# Patient Record
Sex: Female | Born: 1995 | Hispanic: No | Marital: Married | Smoking: Never smoker
Health system: Southern US, Community
[De-identification: ages and names within clinical notes are randomized; demographics above are authoritative.]

## PROBLEM LIST (undated history)

## (undated) ENCOUNTER — Inpatient Hospital Stay (HOSPITAL_COMMUNITY): Payer: Self-pay

## (undated) DIAGNOSIS — Z789 Other specified health status: Secondary | ICD-10-CM

## (undated) DIAGNOSIS — D649 Anemia, unspecified: Secondary | ICD-10-CM

## (undated) DIAGNOSIS — T7840XA Allergy, unspecified, initial encounter: Secondary | ICD-10-CM

## (undated) HISTORY — DX: Other specified health status: Z78.9

## (undated) HISTORY — PX: NO PAST SURGERIES: SHX2092

## (undated) HISTORY — DX: Anemia, unspecified: D64.9

## (undated) HISTORY — DX: Allergy, unspecified, initial encounter: T78.40XA

---

## 2018-01-26 NOTE — L&D Delivery Note (Addendum)
Delivery Note At 3:14 PM a viable female was delivered via Vaginal, Spontaneous (Presentation:LOA).  APGAR: 8, 9; weight pending.  Placenta status: spontaneous and intact, Cord: 3 vessel cord    Anesthesia:  Epidural  Episiotomy: None Lacerations: 2nd degree;Labial;Periurethral Suture Repair: 3.0 vicryl rapide Est. Blood Loss (mL): 230  Mom to postpartum.  Baby to Couplet care / Skin to Skin.  Lattie Haw MD PGY-1, [place]  10/01/2018, 3:50 PM  Patient is a  G1P0 at [redacted]w[redacted]d who was admitted SROM/SOL, uncomplicated prenatal course.  She progressed with augmentation via pitocin during second stage.  I was gloved and present for delivery in its entirety.  Second stage of labor progressed, baby delivered after 2 hours.  Wende Mott, CNM 4:35 PM

## 2018-02-26 ENCOUNTER — Emergency Department (HOSPITAL_COMMUNITY)
Admission: EM | Admit: 2018-02-26 | Discharge: 2018-02-26 | Disposition: A | Discharge status: Home / Self Care | Payer: Medicaid Other | Attending: Emergency Medicine | Admitting: Emergency Medicine

## 2018-02-26 ENCOUNTER — Encounter (HOSPITAL_COMMUNITY): Payer: Self-pay

## 2018-02-26 DIAGNOSIS — R1013 Epigastric pain: Secondary | ICD-10-CM | POA: Diagnosis present

## 2018-02-26 LAB — CBC WITH DIFFERENTIAL/PLATELET
Abs Immature Granulocytes: 0.04 10*3/uL (ref 0.00–0.07)
Basophils Absolute: 0 10*3/uL (ref 0.0–0.1)
Basophils Relative: 0 %
Eosinophils Absolute: 0 10*3/uL (ref 0.0–0.5)
Eosinophils Relative: 0 %
HCT: 36.7 % (ref 36.0–46.0)
Hemoglobin: 12.3 g/dL (ref 12.0–15.0)
Immature Granulocytes: 0 %
LYMPHS PCT: 11 %
Lymphs Abs: 1.2 10*3/uL (ref 0.7–4.0)
MCH: 28.1 pg (ref 26.0–34.0)
MCHC: 33.5 g/dL (ref 30.0–36.0)
MCV: 83.8 fL (ref 80.0–100.0)
Monocytes Absolute: 0.4 10*3/uL (ref 0.1–1.0)
Monocytes Relative: 4 %
NEUTROS ABS: 8.7 10*3/uL — AB (ref 1.7–7.7)
Neutrophils Relative %: 85 %
Platelets: 220 10*3/uL (ref 150–400)
RBC: 4.38 MIL/uL (ref 3.87–5.11)
RDW: 12.5 % (ref 11.5–15.5)
WBC: 10.4 10*3/uL (ref 4.0–10.5)
nRBC: 0 % (ref 0.0–0.2)

## 2018-02-26 LAB — URINALYSIS, ROUTINE W REFLEX MICROSCOPIC
BILIRUBIN URINE: NEGATIVE
Glucose, UA: NEGATIVE mg/dL
HGB URINE DIPSTICK: NEGATIVE
Ketones, ur: 20 mg/dL — AB
Leukocytes, UA: NEGATIVE
Nitrite: NEGATIVE
Protein, ur: NEGATIVE mg/dL
Specific Gravity, Urine: 1.008 (ref 1.005–1.030)
pH: 6 (ref 5.0–8.0)

## 2018-02-26 LAB — COMPREHENSIVE METABOLIC PANEL
ALK PHOS: 37 U/L — AB (ref 38–126)
ALT: 13 U/L (ref 0–44)
AST: 16 U/L (ref 15–41)
Albumin: 4.2 g/dL (ref 3.5–5.0)
Anion gap: 13 (ref 5–15)
BILIRUBIN TOTAL: 0.3 mg/dL (ref 0.3–1.2)
BUN: 6 mg/dL (ref 6–20)
CALCIUM: 9.2 mg/dL (ref 8.9–10.3)
CO2: 19 mmol/L — ABNORMAL LOW (ref 22–32)
CREATININE: 0.42 mg/dL — AB (ref 0.44–1.00)
Chloride: 101 mmol/L (ref 98–111)
GFR calc Af Amer: >60 mL/min (ref >60)
GFR calc non Af Amer: >60 mL/min (ref >60)
Glucose, Bld: 100 mg/dL — ABNORMAL HIGH (ref 70–99)
Potassium: 3.4 mmol/L — ABNORMAL LOW (ref 3.5–5.1)
Sodium: 133 mmol/L — ABNORMAL LOW (ref 135–145)
TOTAL PROTEIN: 6.8 g/dL (ref 6.5–8.1)

## 2018-02-26 LAB — HCG, QUANTITATIVE, PREGNANCY: hCG, Beta Chain, Quant, S: 162561 m[IU]/mL — ABNORMAL HIGH (ref <5)

## 2018-02-26 LAB — LIPASE, BLOOD: Lipase: 25 U/L (ref 11–51)

## 2018-02-26 NOTE — Discharge Instructions (Addendum)
Try eating small frequent meals. Drink hydrating fluids such as Gatorade or G2. Follow-up with your OB as scheduled, return to ER for any worsening or concerning symptoms.

## 2018-02-26 NOTE — ED Triage Notes (Signed)
Pt reports abd pain that radiates to her back that started today. Pt pregnant. LMP 12/15/17. Denies any vaginal bleeding.

## 2018-02-26 NOTE — ED Notes (Signed)
Pt unable to void at this time. 

## 2018-02-26 NOTE — ED Notes (Signed)
Pt tolerating water well, denies any nausea

## 2018-02-26 NOTE — ED Provider Notes (Signed)
[facility] EMERGENCY DEPARTMENT Provider Note   CSN: [id number] Arrival date & time: 02/26/18  1945     History   Chief Complaint Chief Complaint  Patient presents with  . Abdominal Pain  . Back Pain    HPI Suzanne Velez is a 23 y.o. female.  23 year old female presents with complaint of epigastric abdominal pain.  Patient is approximately 2 months pregnant, LMP December 15, 2017, first pregnancy, scheduled for OB visit with first ultrasound next week on Thursday.  Patient states episode of epigastric pain that happened earlier today lasting 15 minutes, radiates to midback and has significantly lessened.  Patient reports having vomiting only in the mornings, has been able to tolerate small foods today including strawberries.  Patient denies nausea or vomiting at this time, changes in bowel or bladder habits, fevers, chills, vaginal discharge or bleeding.  Denies pelvic pain.  No other complaints or concerns.     History reviewed. No pertinent past medical history.  There are no active problems to display for this patient.   History reviewed. No pertinent surgical history.   OB History   No obstetric history on file.      Home Medications    Prior to Admission medications   Not on File    Family History No family history on file.  Social History Social History   Tobacco Use  . Smoking status: Not on file  Substance Use Topics  . Alcohol use: Not on file  . Drug use: Not on file     Allergies   Patient has no allergy information on record.   Review of Systems Review of Systems  Constitutional: Negative for chills and fever.  Respiratory: Negative for shortness of breath.   Cardiovascular: Negative for chest pain.  Gastrointestinal: Positive for abdominal pain, nausea and vomiting. Negative for constipation and diarrhea.  Genitourinary: Negative for dysuria, frequency, urgency, vaginal bleeding and vaginal discharge.  Musculoskeletal:  Positive for back pain.  Skin: Negative for rash and wound.  Allergic/Immunologic: Negative for immunocompromised state.  Neurological: Negative for dizziness and weakness.  Psychiatric/Behavioral: Negative for confusion.  All other systems reviewed and are negative.    Physical Exam Updated Vital Signs BP (!) 111/59 (BP Location: Right Arm)   Pulse 74   Temp 97.8 F (36.6 C) (Oral)   Resp 16   LMP 12/15/2017 (Approximate)   SpO2 100%   Physical Exam Vitals signs and nursing note reviewed.  Constitutional:      General: She is not in acute distress.    Appearance: She is well-developed. She is not diaphoretic.  HENT:     Head: Normocephalic and atraumatic.  Cardiovascular:     Rate and Rhythm: Normal rate and regular rhythm.     Heart sounds: Normal heart sounds. No murmur.  Pulmonary:     Effort: Pulmonary effort is normal.     Breath sounds: Normal breath sounds.  Abdominal:     General: Abdomen is flat. There is no distension.     Palpations: Abdomen is soft.     Tenderness: There is no abdominal tenderness. There is no right CVA tenderness or left CVA tenderness.  Musculoskeletal:     Thoracic back: She exhibits no tenderness and no bony tenderness.     Lumbar back: She exhibits no tenderness and no bony tenderness.  Skin:    General: Skin is warm and dry.     Coloration: Skin is not pale.     Findings: No rash.  Neurological:     Mental Status: She is alert and oriented to person, place, and time.  Psychiatric:        Behavior: Behavior normal.      ED Treatments / Results  Labs (all labs ordered are listed, but only abnormal results are displayed) Labs Reviewed  CBC WITH DIFFERENTIAL/PLATELET - Abnormal; Notable for the following components:      Result Value   Neutro Abs 8.7 (*)    All other components within normal limits  COMPREHENSIVE METABOLIC PANEL - Abnormal; Notable for the following components:   Sodium 133 (*)    Potassium 3.4 (*)    CO2  19 (*)    Glucose, Bld 100 (*)    Creatinine, Ser 0.42 (*)    Alkaline Phosphatase 37 (*)    All other components within normal limits  URINALYSIS, ROUTINE W REFLEX MICROSCOPIC - Abnormal; Notable for the following components:   Ketones, ur 20 (*)    All other components within normal limits  LIPASE, BLOOD  HCG, QUANTITATIVE, PREGNANCY    EKG None  Radiology No results found.  Procedures Procedures (including critical care time)  Medications Ordered in ED Medications - No data to display   Initial Impression / Assessment and Plan / ED Course  I have reviewed the triage vital signs and the nursing notes.  Pertinent labs & imaging results that were available during my care of the patient were reviewed by me and considered in my medical decision making (see chart for details).  Clinical Course as of Feb 26 2114  Sat Feb 26, 2018  [id number] 23 year old female presents with complaint of epigastric abdominal pain which occurred earlier today lasting 15 minutes and has mostly resolved.  On exam abdomen is soft and nontender.  Patient is approximately 2 months pregnant, scheduled for OB appointment in 1 week, denies pelvic pain, no pelvic tenderness on exam.  No reports of vaginal bleeding or discharge.  Lab work including CBC, CMP, urinalysis and lipase completed.  Chemistry with bicarb 19, urinalysis ketones.  Patient given p.o. fluids prior to discharge.   [LM]    Clinical Course User Index [LM] Jeannie FendMurphy, Aviva Wolfer A, PA-C   Final Clinical Impressions(s) / ED Diagnoses   Final diagnoses:  Epigastric pain    ED Discharge Orders    None       Alden HippMurphy, Zachary Lovins A, PA-C 02/26/18 2116    Marily MemosMesner, Jason, MD 02/26/18 2213

## 2018-03-03 ENCOUNTER — Other Ambulatory Visit (HOSPITAL_COMMUNITY): Payer: Self-pay | Admitting: Family

## 2018-03-03 DIAGNOSIS — Z369 Encounter for antenatal screening, unspecified: Secondary | ICD-10-CM

## 2018-03-17 ENCOUNTER — Encounter (HOSPITAL_COMMUNITY): Payer: Self-pay

## 2018-03-22 ENCOUNTER — Ambulatory Visit (HOSPITAL_COMMUNITY): Payer: Medicaid Other

## 2018-03-23 ENCOUNTER — Encounter (HOSPITAL_COMMUNITY): Payer: Self-pay

## 2018-03-24 ENCOUNTER — Encounter (HOSPITAL_COMMUNITY): Payer: Self-pay | Admitting: *Deleted

## 2018-03-24 ENCOUNTER — Ambulatory Visit (HOSPITAL_COMMUNITY): Payer: Medicaid Other | Admitting: *Deleted

## 2018-03-24 ENCOUNTER — Ambulatory Visit (HOSPITAL_BASED_OUTPATIENT_CLINIC_OR_DEPARTMENT_OTHER): Payer: Medicaid Other | Admitting: *Deleted

## 2018-03-24 ENCOUNTER — Ambulatory Visit (HOSPITAL_COMMUNITY)
Admission: RE | Admit: 2018-03-24 | Discharge: 2018-03-24 | Disposition: A | Discharge status: Home / Self Care | Payer: Medicaid Other | Source: Ambulatory Visit | Attending: Family | Admitting: Family

## 2018-03-24 VITALS — BP 114/68 | HR 98 | Ht 65.0 in | Wt 149.0 lb

## 2018-03-24 DIAGNOSIS — Z3A13 13 weeks gestation of pregnancy: Secondary | ICD-10-CM | POA: Diagnosis not present

## 2018-03-24 DIAGNOSIS — Z3682 Encounter for antenatal screening for nuchal translucency: Secondary | ICD-10-CM | POA: Insufficient documentation

## 2018-03-24 DIAGNOSIS — Z369 Encounter for antenatal screening, unspecified: Secondary | ICD-10-CM | POA: Diagnosis not present

## 2018-03-24 NOTE — Progress Notes (Signed)
[employer] interpreter utilized for RN intake.

## 2018-03-29 ENCOUNTER — Other Ambulatory Visit (HOSPITAL_COMMUNITY): Payer: Self-pay | Admitting: Obstetrics and Gynecology

## 2018-04-05 ENCOUNTER — Inpatient Hospital Stay (HOSPITAL_COMMUNITY)
Admission: AD | Admit: 2018-04-05 | Discharge: 2018-04-05 | Disposition: A | Discharge status: Home / Self Care | Payer: Medicaid Other | Attending: Obstetrics and Gynecology | Admitting: Obstetrics and Gynecology

## 2018-04-05 ENCOUNTER — Other Ambulatory Visit: Payer: Self-pay

## 2018-04-05 ENCOUNTER — Encounter (HOSPITAL_COMMUNITY): Payer: Self-pay | Admitting: *Deleted

## 2018-04-05 DIAGNOSIS — W19XXXA Unspecified fall, initial encounter: Secondary | ICD-10-CM | POA: Diagnosis not present

## 2018-04-05 DIAGNOSIS — Z3A15 15 weeks gestation of pregnancy: Secondary | ICD-10-CM | POA: Insufficient documentation

## 2018-04-05 DIAGNOSIS — O26892 Other specified pregnancy related conditions, second trimester: Secondary | ICD-10-CM | POA: Insufficient documentation

## 2018-04-05 DIAGNOSIS — M545 Low back pain: Secondary | ICD-10-CM | POA: Diagnosis not present

## 2018-04-05 DIAGNOSIS — W109XXA Fall (on) (from) unspecified stairs and steps, initial encounter: Secondary | ICD-10-CM | POA: Diagnosis not present

## 2018-04-05 LAB — URINALYSIS, ROUTINE W REFLEX MICROSCOPIC
Bilirubin Urine: NEGATIVE
Glucose, UA: 50 mg/dL — AB
Hgb urine dipstick: NEGATIVE
KETONES UR: 5 mg/dL — AB
Nitrite: NEGATIVE
Protein, ur: NEGATIVE mg/dL
Specific Gravity, Urine: 1.027 (ref 1.005–1.030)
pH: 5 (ref 5.0–8.0)

## 2018-04-05 NOTE — Discharge Instructions (Signed)
Preventing Injuries During Pregnancy °Trauma is the most common cause of injury and death in pregnant women. This can also result in serious harm to the baby or even death. °How can injuries affect my pregnancy? °Your baby is protected in the womb (uterus) by a sac filled with fluid (amniotic sac). Your baby can be harmed if there is a direct blow to your abdomen and pelvis. Trauma may be caused by: °· Falls. These are more common in the second and third trimester of pregnancy. °· Automobile accidents. °· Domestic violence or assault. °· Severe burns, such as from fire or electricity. °These injuries can result in: °· Tearing of your uterus. °· The placenta pulling away from the wall of the uterus (placental abruption). °· The amniotic sac breaking open (rupture of membranes). °· Blockage or decrease in the blood supply to your baby. °· Going into labor earlier than expected. °· Severe injuries to other parts of your body, such as your brain, spine, heart, lungs, or other organs. °Minor falls and low-impact automobile accidents do not usually harm your baby, even if they cause a little harm to you. °What can I do to lower my risk? °Safety °· Remove slippery rugs and loose objects on the floor. They increase your risk of tripping or slipping. °· Wear comfortable shoes that have a good grip on the sole. Do not wear high-heeled shoes. °· Always wear your seat belt properly when riding in a car. Use both the lap and shoulder belt, with the lap belt below your abdomen. Always practice safe driving. Do not ride on a motorcycle while pregnant. °Activity °· Avoid walking on wet or slippery floors. °· Do not participate in rough and violent activities or sports. °· Avoid high-risk situations and activities such as: °? Lifting heavy pots of boiling or hot liquids. °? Fixing electrical problems. °? Being near fires or starting fires. °General instructions °· Take over-the-counter and prescription medicines only as told by your  health care provider. °· Know your blood type and the father's blood type in case you develop vaginal bleeding or experience an injury for which a blood transfusion is needed. °· Spousal abuse can be a serious cause of trauma during pregnancy. If you are a victim of domestic violence or assault: °? Call your local emergency services (911 in the [place] °? Contact the National Domestic Violence Hotline for help and support. °When should I seek immediate medical care? °Get help right away if: °· You fall on your abdomen or experience any serious blow to your abdomen. °· You develop stiffness in your neck or pain after a fall or from other trauma. °· You develop a headache or vision problems after a fall or from other trauma. °· You do not feel the baby moving after a fall or trauma, or you feel that the baby is not moving as much as before the fall or trauma. °· You have been the victim of domestic violence or any other kind of physical attack. °· You have been in a car accident. °· You develop vaginal bleeding. °· You have fluid leaking from the vagina. °· You develop uterine contractions. Symptoms include pelvic cramping, pain, or serious low back pain. °· You become weak, faint, or have uncontrolled vomiting after trauma. °· You have a serious burn. This includes burns to the face, neck, hands, or genitals, or burns greater than the size of your palm anywhere else. °Summary °· Trauma is the most common cause of injury and death   in pregnant women and can also lead to injury or death of the baby. °· Falls, automobile accidents, domestic violence or assault, and severe burns can injure you or your baby. Make sure to get medical help right away if you experience any of these during your pregnancy. °· Take steps to prevent slips or falls in your home, such as avoiding slippery floors and removing loose rugs. °· Always wear your seat belt properly when riding in a car. Practice safe driving. °This information is not  intended to replace advice given to you by your health care provider. Make sure you discuss any questions you have with your health care provider. °Document Released: 02/20/2004 Document Revised: 01/22/2016 Document Reviewed: 01/22/2016 °Elsevier Interactive Patient Education © 2019 [employer] ° °

## 2018-04-05 NOTE — MAU Provider Note (Signed)
History     CSN: [id number]  Arrival date and time: 04/05/18 1524   First Provider Initiated Contact with Patient 04/05/18 1636      Chief Complaint  Patient presents with  . Fall  . Abdominal Pain   HPI Suzanne Velez is a 23 y.o. G1P0 at [redacted]w[redacted]d who presents to MAU for initial evaluation after fall this morning around 10a. Patient states she fell down about 8 steps, intentionally twisted to avoid abdominal trauma and hit her shoulder and leg on the stairs. She reports right-sided lower back pain which she describes as "sore". She denies abdominal pain and bleeding. She has not taken medication. She declines Tylenol for back pain in MAU.  Patient and her husband are requesting "gender ultrasound" upon Provider introduction in exam room.   She receives care at [facility].  OB History    Gravida  1   Para      Term      Preterm      AB      Living        SAB      TAB      Ectopic      Multiple      Live Births              Past Medical History:  Diagnosis Date  . Medical history non-contributory     Past Surgical History:  Procedure Laterality Date  . NO PAST SURGERIES      History reviewed. No pertinent family history.  Social History   Tobacco Use  . Smoking status: Never Smoker  . Smokeless tobacco: Never Used  Substance Use Topics  . Alcohol use: Never    Frequency: Never  . Drug use: Never    Allergies: No Known Allergies  Medications Prior to Admission  Medication Sig Dispense Refill Last Dose  . Prenatal Vit-Fe Fumarate-FA (PRENATAL VITAMIN PO) Take by mouth.       Review of Systems  Constitutional: Negative for chills, fatigue and fever.  Respiratory: Negative for shortness of breath.   Gastrointestinal: Negative for abdominal pain.  Genitourinary: Negative for difficulty urinating, flank pain, vaginal bleeding, vaginal discharge and vaginal pain.  Musculoskeletal: Positive for back pain.  All other systems reviewed and are  negative.  Physical Exam   Blood pressure 122/68, pulse 92, temperature 98.1 F (36.7 C), temperature source Oral, resp. rate 16, weight 67.4 kg, last menstrual period 12/20/2017, SpO2 100 %.  Physical Exam  Nursing note and vitals reviewed. Constitutional: She is oriented to person, place, and time. She appears well-developed and well-nourished.  Cardiovascular: Normal rate.  Respiratory: Effort normal.  GI: Soft. She exhibits no distension and no mass. There is no abdominal tenderness. There is no rebound, no guarding and no CVA tenderness.  Musculoskeletal: Normal range of motion.  Neurological: She is alert and oriented to person, place, and time.  Skin: Skin is warm and dry.  Psychiatric: She has a normal mood and affect. Her behavior is normal. Judgment and thought content normal.    MAU Course  Procedures   --No abdominal pain or bleeding. Non-tender to deep palpation --Reviewed medical indications for ultrasound in MAU. Clarified that gender would be an incidental finding rather than indication for imaging  Patient Vitals for the past 24 hrs:  BP Temp Temp src Pulse Resp SpO2 Weight  04/05/18 1549 122/68 98.1 F (36.7 C) Oral 92 16 100 % 67.4 kg    Results for orders placed or performed  during the hospital encounter of 04/05/18 (from the past 24 hour(s))  Urinalysis, Routine w reflex microscopic     Status: Abnormal   Collection Time: 04/05/18  4:13 PM  Result Value Ref Range   Color, Urine YELLOW YELLOW   APPearance HAZY (A) CLEAR   Specific Gravity, Urine 1.027 1.005 - 1.030   pH 5.0 5.0 - 8.0   Glucose, UA 50 (A) NEGATIVE mg/dL   Hgb urine dipstick NEGATIVE NEGATIVE   Bilirubin Urine NEGATIVE NEGATIVE   Ketones, ur 5 (A) NEGATIVE mg/dL   Protein, ur NEGATIVE NEGATIVE mg/dL   Nitrite NEGATIVE NEGATIVE   Leukocytes,Ua TRACE (A) NEGATIVE   RBC / HPF 0-5 0 - 5 RBC/hpf   WBC, UA 6-10 0 - 5 WBC/hpf   Bacteria, UA RARE (A) NONE SEEN   Squamous Epithelial / LPF 0-5  0 - 5   Mucus PRESENT     Assessment and Plan  --23 y.o. G1P0 at [redacted]w[redacted]d s/p fall at 10am today --FHT 155 by Doppler, no abdominal pain or bleeding --Declines Tylenol for back pain --Reviewed symptoms which would merit return to MAU --Language barrier: patient has signed consent for husband to serve as interpreter --Discharge home in stable condition  F/U: Next ob appt at [facility] in April  Calvert Cantor, CNM 04/05/2018, 5:04 PM

## 2018-04-05 NOTE — MAU Note (Signed)
Fell down stairs, maybe 8, around 1330.when she fell, she fell on her left leg and shoulder, an hour later she started having pain  At her waist, on rt side. No bleeding

## 2018-04-05 NOTE — Progress Notes (Signed)
RN came to room to discharge patient and patient was gone.  Unable to do repeat set of VS or give printed copy of AVS.

## 2018-07-04 ENCOUNTER — Other Ambulatory Visit (HOSPITAL_COMMUNITY): Payer: Self-pay | Admitting: Nurse Practitioner

## 2018-07-05 ENCOUNTER — Other Ambulatory Visit: Payer: Self-pay | Admitting: Obstetrics and Gynecology

## 2018-07-05 DIAGNOSIS — N6320 Unspecified lump in the left breast, unspecified quadrant: Secondary | ICD-10-CM

## 2018-07-15 ENCOUNTER — Other Ambulatory Visit: Payer: Self-pay | Admitting: Obstetrics and Gynecology

## 2018-07-15 ENCOUNTER — Other Ambulatory Visit: Payer: Self-pay

## 2018-07-15 ENCOUNTER — Ambulatory Visit
Admission: RE | Admit: 2018-07-15 | Discharge: 2018-07-15 | Disposition: A | Discharge status: Home / Self Care | Payer: Medicaid Other | Source: Ambulatory Visit | Attending: Obstetrics and Gynecology | Admitting: Obstetrics and Gynecology

## 2018-07-15 DIAGNOSIS — N6320 Unspecified lump in the left breast, unspecified quadrant: Secondary | ICD-10-CM

## 2018-07-20 ENCOUNTER — Other Ambulatory Visit: Payer: Medicaid Other

## 2018-08-08 ENCOUNTER — Ambulatory Visit (HOSPITAL_COMMUNITY): Payer: Medicaid Other

## 2018-09-22 ENCOUNTER — Inpatient Hospital Stay (HOSPITAL_COMMUNITY)
Admission: AD | Admit: 2018-09-22 | Discharge: 2018-09-22 | Disposition: A | Discharge status: Home / Self Care | Payer: Medicaid Other | Attending: Family Medicine | Admitting: Family Medicine

## 2018-09-22 ENCOUNTER — Encounter (HOSPITAL_COMMUNITY): Payer: Self-pay

## 2018-09-22 ENCOUNTER — Other Ambulatory Visit: Payer: Self-pay

## 2018-09-22 DIAGNOSIS — O479 False labor, unspecified: Secondary | ICD-10-CM | POA: Diagnosis not present

## 2018-09-22 DIAGNOSIS — Z3A39 39 weeks gestation of pregnancy: Secondary | ICD-10-CM | POA: Diagnosis not present

## 2018-09-22 DIAGNOSIS — O471 False labor at or after 37 completed weeks of gestation: Secondary | ICD-10-CM | POA: Insufficient documentation

## 2018-09-22 NOTE — MAU Provider Note (Signed)
.  S: Suzanne Velez is a 23 y.o. G1P0 at [redacted]w[redacted]d  who presents to MAU today for labor evaluation.     Cervical exam by RN:  Dilation: 2 Effacement (%): 60 Cervical Position: Posterior Station: -1 Presentation: Vertex Exam by:: benji stanley RN  Fetal Monitoring: Baseline: 145 Variability: average Accelerations: present Decelerations: absent Contractions: irregular  MDM Discussed patient with RN. NST reviewed.   A: SIUP at [redacted]w[redacted]d  False labor  P: Discharge home Labor precautions and kick counts included in AVS Patient to follow-up with office as scheduled  Patient may return to MAU as needed or when in labor   Seabron Spates, [place] 09/22/2018 11:15 PM

## 2018-09-22 NOTE — Discharge Instructions (Signed)
Vaginal Delivery  Vaginal delivery means that you give birth by pushing your baby out of your birth canal (vagina). A team of health care providers will help you before, during, and after vaginal delivery. Birth experiences are unique for every woman and every pregnancy, and birth experiences vary depending on where you choose to give birth. What happens when I arrive at the birth center or hospital? Once you are in labor and have been admitted into the hospital or birth center, your health care provider may:  Review your pregnancy history and any concerns that you have.  Insert an IV into one of your veins. This may be used to give you fluids and medicines.  Check your blood pressure, pulse, temperature, and heart rate (vital signs).  Check whether your bag of water (amniotic sac) has broken (ruptured).  Talk with you about your birth plan and discuss pain control options. Monitoring Your health care provider may monitor your contractions (uterine monitoring) and your baby's heart rate (fetal monitoring). You may need to be monitored:  Often, but not continuously (intermittently).  All the time or for long periods at a time (continuously). Continuous monitoring may be needed if: ? You are taking certain medicines, such as medicine to relieve pain or make your contractions stronger. ? You have pregnancy or labor complications. Monitoring may be done by:  Placing a special stethoscope or a handheld monitoring device on your abdomen to check your baby's heartbeat and to check for contractions.  Placing monitors on your abdomen (external monitors) to record your baby's heartbeat and the frequency and length of contractions.  Placing monitors inside your uterus through your vagina (internal monitors) to record your baby's heartbeat and the frequency, length, and strength of your contractions. Depending on the type of monitor, it may remain in your uterus or on your baby's head until  birth.  Telemetry. This is a type of continuous monitoring that can be done with external or internal monitors. Instead of having to stay in bed, you are able to move around during telemetry. Physical exam Your health care provider may perform frequent physical exams. This may include:  Checking how and where your baby is positioned in your uterus.  Checking your cervix to determine: ? Whether it is thinning out (effacing). ? Whether it is opening up (dilating). What happens during labor and delivery?  Normal labor and delivery is divided into the following three stages: Stage 1  This is the longest stage of labor.  This stage can last for hours or days.  Throughout this stage, you will feel contractions. Contractions generally feel mild, infrequent, and irregular at first. They get stronger, more frequent (about every 2-3 minutes), and more regular as you move through this stage.  This stage ends when your cervix is completely dilated to 4 inches (10 cm) and completely effaced. Stage 2  This stage starts once your cervix is completely effaced and dilated and lasts until the delivery of your baby.  This stage may last from 20 minutes to 2 hours.  This is the stage where you will feel an urge to push your baby out of your vagina.  You may feel stretching and burning pain, especially when the widest part of your baby's head passes through the vaginal opening (crowning).  Once your baby is delivered, the umbilical cord will be clamped and cut. This usually occurs after waiting a period of 1-2 minutes after delivery.  Your baby will be placed on your bare chest (  skin-to-skin contact) in an upright position and covered with a warm blanket. Watch your baby for feeding cues, like rooting or sucking, and help the baby to your breast for his or her first feeding. Stage 3  This stage starts immediately after the birth of your baby and ends after you deliver the placenta.  This stage may  take anywhere from 5 to 30 minutes.  After your baby has been delivered, you will feel contractions as your body expels the placenta and your uterus contracts to control bleeding. What can I expect after labor and delivery?  After labor is over, you and your baby will be monitored closely until you are ready to go home to ensure that you are both healthy. Your health care team will teach you how to care for yourself and your baby.  You and your baby will stay in the same room (rooming in) during your hospital stay. This will encourage early bonding and successful breastfeeding.  You may continue to receive fluids and medicines through an IV.  Your uterus will be checked and massaged regularly (fundal massage).  You will have some soreness and pain in your abdomen, vagina, and the area of skin between your vaginal opening and your anus (perineum).  If an incision was made near your vagina (episiotomy) or if you had some vaginal tearing during delivery, cold compresses may be placed on your episiotomy or your tear. This helps to reduce pain and swelling.  You may be given a squirt bottle to use instead of wiping when you go to the bathroom. To use the squirt bottle, follow these steps: ? Before you urinate, fill the squirt bottle with warm water. Do not use hot water. ? After you urinate, while you are sitting on the toilet, use the squirt bottle to rinse the area around your urethra and vaginal opening. This rinses away any urine and blood. ? Fill the squirt bottle with clean water every time you use the bathroom.  It is normal to have vaginal bleeding after delivery. Wear a sanitary pad for vaginal bleeding and discharge. Summary  Vaginal delivery means that you will give birth by pushing your baby out of your birth canal (vagina).  Your health care provider may monitor your contractions (uterine monitoring) and your baby's heart rate (fetal monitoring).  Your health care provider may  perform a physical exam.  Normal labor and delivery is divided into three stages.  After labor is over, you and your baby will be monitored closely until you are ready to go home. This information is not intended to replace advice given to you by your health care provider. Make sure you discuss any questions you have with your health care provider. Document Released: 10/22/2007 Document Revised: 02/16/2017 Document Reviewed: 02/16/2017 Elsevier Patient Education  2020 [employer] of the uterus can occur throughout pregnancy, but they are not always a sign that you are in labor. You may have practice contractions called Braxton Hicks contractions. These false labor contractions are sometimes confused with true labor. What are [place] contractions? Braxton Hicks contractions are tightening movements that occur in the muscles of the uterus before labor. Unlike true labor contractions, these contractions do not result in opening (dilation) and thinning of the cervix. Toward the end of pregnancy (32-34 weeks), Braxton Hicks contractions can happen more often and may become stronger. These contractions are sometimes difficult to tell apart from true labor because they can be very uncomfortable. You should  not feel embarrassed if you go to the hospital with false labor. Sometimes, the only way to tell if you are in true labor is for your health care provider to look for changes in the cervix. The health care provider will do a physical exam and may monitor your contractions. If you are not in true labor, the exam should show that your cervix is not dilating and your water has not broken. If there are no other health problems associated with your pregnancy, it is completely safe for you to be sent home with false labor. You may continue to have Braxton Hicks contractions until you go into true labor. How to tell the difference between true labor and false  labor True labor  Contractions last 30-70 seconds.  Contractions become very regular.  Discomfort is usually felt in the top of the uterus, and it spreads to the lower abdomen and low back.  Contractions do not go away with walking.  Contractions usually become more intense and increase in frequency.  The cervix dilates and gets thinner. False labor  Contractions are usually shorter and not as strong as true labor contractions.  Contractions are usually irregular.  Contractions are often felt in the front of the lower abdomen and in the groin.  Contractions may go away when you walk around or change positions while lying down.  Contractions get weaker and are shorter-lasting as time goes on.  The cervix usually does not dilate or become thin. Follow these instructions at home:   Take over-the-counter and prescription medicines only as told by your health care provider.  Keep up with your usual exercises and follow other instructions from your health care provider.  Eat and drink lightly if you think you are going into labor.  If Braxton Hicks contractions are making you uncomfortable: ? Change your position from lying down or resting to walking, or change from walking to resting. ? Sit and rest in a tub of warm water. ? Drink enough fluid to keep your urine pale yellow. Dehydration may cause these contractions. ? Do slow and deep breathing several times an hour.  Keep all follow-up prenatal visits as told by your health care provider. This is important. Contact a health care provider if:  You have a fever.  You have continuous pain in your abdomen. Get help right away if:  Your contractions become stronger, more regular, and closer together.  You have fluid leaking or gushing from your vagina.  You pass blood-tinged mucus (bloody show).  You have bleeding from your vagina.  You have low back pain that you never had before.  You feel your babys head pushing  down and causing pelvic pressure.  Your baby is not moving inside you as much as it used to. Summary  Contractions that occur before labor are called Braxton Hicks contractions, false labor, or practice contractions.  Braxton Hicks contractions are usually shorter, weaker, farther apart, and less regular than true labor contractions. True labor contractions usually become progressively stronger and regular, and they become more frequent.  Manage discomfort from [facility] Inc[facility]xton Hicks contractions by changing position, resting in a warm bath, drinking plenty of water, or practicing deep breathing. This information is not intended to replace advice given to you by your health care provider. Make sure you discuss any questions you have with your health care provider. Document Released: 05/28/2016 Document Revised: 12/25/2016 Document Reviewed: 05/28/2016 Elsevier Patient Education  2020 [employer]

## 2018-09-22 NOTE — MAU Note (Signed)
I have communicated with Hansel Feinstein CNM and reviewed vital signs:  Vitals:   09/22/18 2231 09/22/18 2318  BP: 116/69 117/68  Pulse: (!) 116 (!) 106  Resp: 16 16  Temp: 98.3 F (36.8 C) 98.3 F (36.8 C)    Vaginal exam:  Dilation: 2 Effacement (%): 60 Cervical Position: Posterior Station: -1 Presentation: Vertex Exam by:: Shreyas Piatkowski Production designer, theatre/television/film,   Also reviewed contraction pattern and that non-stress test is reactive.  It has been documented that patient is contracting every 5-7 minutes with no cervical change since last office exam, pt prefers to be discharged home and not to stay for additional hour for recheck, notified Hurshel Keys. Patient denies any other complaints.  Based on this report provider has given order for discharge.  A discharge order and diagnosis entered by a provider.   Labor discharge instructions reviewed with patient.

## 2018-09-22 NOTE — MAU Note (Signed)
States pt has had some mild abd pain RLQ today that goes to back at times. Stomach has been hard most of the day. Gets soft occasionally. Denies LOF or bleeding. Good FM. 2cm last sve. Pt had h/a earlier but not now.

## 2018-09-25 ENCOUNTER — Inpatient Hospital Stay (HOSPITAL_COMMUNITY)
Admission: AD | Admit: 2018-09-25 | Discharge: 2018-09-25 | Disposition: A | Discharge status: Home / Self Care | Payer: Medicaid Other | Attending: Obstetrics & Gynecology | Admitting: Obstetrics & Gynecology

## 2018-09-25 ENCOUNTER — Other Ambulatory Visit: Payer: Self-pay

## 2018-09-25 ENCOUNTER — Encounter (HOSPITAL_COMMUNITY): Payer: Self-pay | Admitting: *Deleted

## 2018-09-25 DIAGNOSIS — O26893 Other specified pregnancy related conditions, third trimester: Secondary | ICD-10-CM | POA: Diagnosis not present

## 2018-09-25 DIAGNOSIS — Z3689 Encounter for other specified antenatal screening: Secondary | ICD-10-CM

## 2018-09-25 DIAGNOSIS — O471 False labor at or after 37 completed weeks of gestation: Secondary | ICD-10-CM | POA: Diagnosis not present

## 2018-09-25 DIAGNOSIS — O479 False labor, unspecified: Secondary | ICD-10-CM

## 2018-09-25 DIAGNOSIS — Z3A39 39 weeks gestation of pregnancy: Secondary | ICD-10-CM | POA: Insufficient documentation

## 2018-09-25 DIAGNOSIS — R42 Dizziness and giddiness: Secondary | ICD-10-CM | POA: Diagnosis not present

## 2018-09-25 NOTE — MAU Provider Note (Signed)
First Provider Initiated Contact with Patient 09/25/18 2333       S: Ms. Suzanne Velez is a 23 y.o. G1P0 at [redacted]w[redacted]d  who presents to MAU today complaining contractions q 2-10 minutes since  Thursday. FOB is acting as interpreter and reports patient denies vaginal bleeding and LOF. She endorses normal fetal movement.  Per Husband, patient with incident of dizziness at 2030 and ate around 2130 with no dizziness since.  He states he was told that bp is main contributor to dizziness.   O: BP 127/73   Pulse 99   Temp (!) 97.3 F (36.3 C) (Oral)   Resp 16   Ht 5\' 7"  (1.702 m)   Wt 86.2 kg   LMP 12/20/2017 (Exact Date)   BMI 29.76 kg/m  GENERAL: Well-developed, well-nourished female in no acute distress.  HEAD: Normocephalic, atraumatic.  CHEST: Normal effort of breathing, regular heart rate ABDOMEN: Soft, nontender, gravid  Cervical exam:  Dilation: 2 Effacement (%): 60 Station: -2 Exam by:: Gavin Pound, CNM   Fetal Monitoring: Baseline: 145 Variability: Moderate Accelerations: Present Decelerations: Absent Contractions: Q79min   A: SIUP at [redacted]w[redacted]d  Cat I FT Dizziness Uterine Contractions   P: -Exam findings discussed. -Informed no change from last assessment. -Declines assessment for dizziness stating "the blood pressure is normal so we don't care about that." -Of note FOB required several promptings, by provider, to interpret conversation for patient. -Extensive discussion regarding labor precautions including 5/1/1 with perception of painful contractions.  -Instructed to keep next scheduled appt: Tuesday Sept 1 at [facility] to call or return to MAU if symptoms worsen or with the onset of new symptoms. -Discharged to home in stable condition.   Gavin Pound, CNM 09/25/2018 11:34 PM

## 2018-09-25 NOTE — Discharge Instructions (Signed)

## 2018-09-25 NOTE — MAU Note (Signed)
I have communicated with Milinda Cave, CNM and reviewed vital signs:  Vitals:   09/25/18 2235 09/25/18 2327  BP: 123/79 127/73  Pulse: (!) 110 99  Resp: 16   Temp: (!) 97.3 F (36.3 C)     Vaginal exam:  Dilation: 2 Effacement (%): 60 Cervical Position: Posterior Station: -2 Presentation: Vertex Exam by:: Gavin Pound, CNM,   Also reviewed contraction pattern and that non-stress test is reactive.  It has been documented that patient is contracting every 5-6 minutes  not indicating active labor.  Patient denies any other complaints.  Based on this report provider has given order for discharge.  A discharge order and diagnosis entered by a provider.   Labor discharge instructions reviewed with patient.

## 2018-09-27 ENCOUNTER — Other Ambulatory Visit: Payer: Self-pay | Admitting: Advanced Practice Midwife

## 2018-09-28 ENCOUNTER — Telehealth (HOSPITAL_COMMUNITY): Payer: Self-pay | Admitting: *Deleted

## 2018-09-28 NOTE — Telephone Encounter (Signed)
Preadmission screen Interpreter number [phone]

## 2018-09-29 ENCOUNTER — Other Ambulatory Visit: Payer: Self-pay | Admitting: Student

## 2018-09-29 ENCOUNTER — Telehealth (HOSPITAL_COMMUNITY): Payer: Self-pay | Admitting: *Deleted

## 2018-09-29 NOTE — Pre-Procedure Instructions (Signed)
Interpreter number [phone]

## 2018-09-29 NOTE — Telephone Encounter (Signed)
Preadmission screen  

## 2018-09-30 ENCOUNTER — Telehealth (HOSPITAL_COMMUNITY): Payer: Self-pay | Admitting: *Deleted

## 2018-09-30 NOTE — Pre-Procedure Instructions (Signed)
Interpreter number [phone]

## 2018-09-30 NOTE — Telephone Encounter (Signed)
Preadmission screen  

## 2018-10-01 ENCOUNTER — Inpatient Hospital Stay (HOSPITAL_COMMUNITY): Payer: Medicaid Other | Admitting: Anesthesiology

## 2018-10-01 ENCOUNTER — Other Ambulatory Visit: Payer: Self-pay

## 2018-10-01 ENCOUNTER — Inpatient Hospital Stay (HOSPITAL_COMMUNITY)
Admission: AD | Admit: 2018-10-01 | Discharge: 2018-10-03 | DRG: 807 | Disposition: A | Discharge status: Home / Self Care | Payer: Medicaid Other | Attending: Obstetrics and Gynecology | Admitting: Obstetrics and Gynecology

## 2018-10-01 ENCOUNTER — Encounter (HOSPITAL_COMMUNITY): Payer: Self-pay

## 2018-10-01 DIAGNOSIS — Z3A40 40 weeks gestation of pregnancy: Secondary | ICD-10-CM

## 2018-10-01 DIAGNOSIS — Z20828 Contact with and (suspected) exposure to other viral communicable diseases: Secondary | ICD-10-CM | POA: Diagnosis present

## 2018-10-01 DIAGNOSIS — O480 Post-term pregnancy: Secondary | ICD-10-CM

## 2018-10-01 DIAGNOSIS — O4290 Premature rupture of membranes, unspecified as to length of time between rupture and onset of labor, unspecified weeks of gestation: Secondary | ICD-10-CM | POA: Diagnosis present

## 2018-10-01 DIAGNOSIS — O26893 Other specified pregnancy related conditions, third trimester: Secondary | ICD-10-CM | POA: Diagnosis present

## 2018-10-01 LAB — ABO/RH: ABO/RH(D): O POS

## 2018-10-01 LAB — CBC
HCT: 35 % — ABNORMAL LOW (ref 36.0–46.0)
Hemoglobin: 11.4 g/dL — ABNORMAL LOW (ref 12.0–15.0)
MCH: 27 pg (ref 26.0–34.0)
MCHC: 32.6 g/dL (ref 30.0–36.0)
MCV: 82.9 fL (ref 80.0–100.0)
Platelets: 226 10*3/uL (ref 150–400)
RBC: 4.22 MIL/uL (ref 3.87–5.11)
RDW: 14.1 % (ref 11.5–15.5)
WBC: 13 10*3/uL — ABNORMAL HIGH (ref 4.0–10.5)
nRBC: 0 % (ref 0.0–0.2)

## 2018-10-01 LAB — TYPE AND SCREEN
ABO/RH(D): O POS
Antibody Screen: NEGATIVE

## 2018-10-01 LAB — SARS CORONAVIRUS 2 BY RT PCR (HOSPITAL ORDER, PERFORMED IN ~~LOC~~ HOSPITAL LAB): SARS Coronavirus 2: NEGATIVE

## 2018-10-01 LAB — POCT FERN TEST: POCT Fern Test: POSITIVE

## 2018-10-01 LAB — RPR: RPR Ser Ql: NONREACTIVE

## 2018-10-01 MED ORDER — IBUPROFEN 600 MG PO TABS
600.0000 mg | ORAL_TABLET | Freq: Four times a day (QID) | ORAL | Status: DC
  Administered 2018-10-02 – 2018-10-03 (×7): 600 mg via ORAL
  Filled 2018-10-01 (×8): qty 1

## 2018-10-01 MED ORDER — FENTANYL-BUPIVACAINE-NACL 0.5-0.125-0.9 MG/250ML-% EP SOLN
12.0000 mL/h | EPIDURAL | Status: DC | PRN
  Filled 2018-10-01: qty 250

## 2018-10-01 MED ORDER — SIMETHICONE 80 MG PO CHEW: 80.0000 mg | CHEWABLE_TABLET | ORAL | Status: DC | PRN

## 2018-10-01 MED ORDER — ZOLPIDEM TARTRATE 5 MG PO TABS: 5.0000 mg | ORAL_TABLET | Freq: Every evening | ORAL | Status: DC | PRN

## 2018-10-01 MED ORDER — TERBUTALINE SULFATE 1 MG/ML IJ SOLN: 0.2500 mg | Freq: Once | INTRAMUSCULAR | Status: DC | PRN

## 2018-10-01 MED ORDER — EPHEDRINE 5 MG/ML INJ: 10.0000 mg | INTRAVENOUS | Status: DC | PRN

## 2018-10-01 MED ORDER — WITCH HAZEL-GLYCERIN EX PADS: 1.0000 "application " | MEDICATED_PAD | CUTANEOUS | Status: DC | PRN

## 2018-10-01 MED ORDER — ONDANSETRON HCL 4 MG/2ML IJ SOLN: 4.0000 mg | Freq: Four times a day (QID) | INTRAMUSCULAR | Status: DC | PRN

## 2018-10-01 MED ORDER — FENTANYL CITRATE (PF) 100 MCG/2ML IJ SOLN
INTRAMUSCULAR | Status: AC
  Filled 2018-10-01: qty 2

## 2018-10-01 MED ORDER — COCONUT OIL OIL
1.0000 "application " | TOPICAL_OIL | Status: DC | PRN
  Administered 2018-10-02: 1 via TOPICAL

## 2018-10-01 MED ORDER — LACTATED RINGERS IV SOLN
INTRAVENOUS | Status: DC
  Administered 2018-10-01 (×2): via INTRAVENOUS

## 2018-10-01 MED ORDER — DIBUCAINE (PERIANAL) 1 % EX OINT: 1.0000 "application " | TOPICAL_OINTMENT | CUTANEOUS | Status: DC | PRN

## 2018-10-01 MED ORDER — PHENYLEPHRINE 40 MCG/ML (10ML) SYRINGE FOR IV PUSH (FOR BLOOD PRESSURE SUPPORT): 80.0000 ug | PREFILLED_SYRINGE | INTRAVENOUS | Status: DC | PRN

## 2018-10-01 MED ORDER — ONDANSETRON HCL 4 MG PO TABS: 4.0000 mg | ORAL_TABLET | ORAL | Status: DC | PRN

## 2018-10-01 MED ORDER — SOD CITRATE-CITRIC ACID 500-334 MG/5ML PO SOLN: 30.0000 mL | ORAL | Status: DC | PRN

## 2018-10-01 MED ORDER — LACTATED RINGERS IV SOLN: 500.0000 mL | INTRAVENOUS | Status: DC | PRN

## 2018-10-01 MED ORDER — SODIUM CHLORIDE (PF) 0.9 % IJ SOLN
INTRAMUSCULAR | Status: DC | PRN
  Administered 2018-10-01: 12 mL/h via EPIDURAL

## 2018-10-01 MED ORDER — LACTATED RINGERS IV SOLN
500.0000 mL | Freq: Once | INTRAVENOUS | Status: AC
  Administered 2018-10-01: 500 mL via INTRAVENOUS

## 2018-10-01 MED ORDER — LIDOCAINE HCL (PF) 1 % IJ SOLN
30.0000 mL | INTRAMUSCULAR | Status: AC | PRN
  Administered 2018-10-01: 30 mL via SUBCUTANEOUS
  Filled 2018-10-01: qty 30

## 2018-10-01 MED ORDER — BENZOCAINE-MENTHOL 20-0.5 % EX AERO
1.0000 "application " | INHALATION_SPRAY | CUTANEOUS | Status: DC | PRN
  Administered 2018-10-02 – 2018-10-03 (×2): 1 via TOPICAL
  Filled 2018-10-01 (×2): qty 56

## 2018-10-01 MED ORDER — ACETAMINOPHEN 325 MG PO TABS: 650.0000 mg | ORAL_TABLET | ORAL | Status: DC | PRN

## 2018-10-01 MED ORDER — LIDOCAINE HCL (PF) 1 % IJ SOLN
INTRAMUSCULAR | Status: DC | PRN
  Administered 2018-10-01: 10 mL via EPIDURAL

## 2018-10-01 MED ORDER — LACTATED RINGERS IV SOLN
500.0000 mL | INTRAVENOUS | Status: DC | PRN
  Administered 2018-10-01: 500 mL via INTRAVENOUS

## 2018-10-01 MED ORDER — DIPHENHYDRAMINE HCL 25 MG PO CAPS: 25.0000 mg | ORAL_CAPSULE | Freq: Four times a day (QID) | ORAL | Status: DC | PRN

## 2018-10-01 MED ORDER — SENNOSIDES-DOCUSATE SODIUM 8.6-50 MG PO TABS
2.0000 | ORAL_TABLET | ORAL | Status: DC
  Administered 2018-10-02 (×2): 2 via ORAL
  Filled 2018-10-01 (×2): qty 2

## 2018-10-01 MED ORDER — OXYCODONE-ACETAMINOPHEN 5-325 MG PO TABS: 2.0000 | ORAL_TABLET | ORAL | Status: DC | PRN

## 2018-10-01 MED ORDER — LACTATED RINGERS IV SOLN
INTRAVENOUS | Status: DC
  Administered 2018-10-01: 03:00:00 via INTRAVENOUS

## 2018-10-01 MED ORDER — OXYTOCIN BOLUS FROM INFUSION: 500.0000 mL | Freq: Once | INTRAVENOUS | Status: DC

## 2018-10-01 MED ORDER — OXYTOCIN BOLUS FROM INFUSION
500.0000 mL | Freq: Once | INTRAVENOUS | Status: AC
  Administered 2018-10-01: 500 mL via INTRAVENOUS

## 2018-10-01 MED ORDER — FENTANYL CITRATE (PF) 100 MCG/2ML IJ SOLN
100.0000 ug | INTRAMUSCULAR | Status: DC | PRN
  Administered 2018-10-01 (×2): 100 ug via INTRAVENOUS
  Filled 2018-10-01: qty 2

## 2018-10-01 MED ORDER — OXYTOCIN 40 UNITS IN NORMAL SALINE INFUSION - SIMPLE MED
2.5000 [IU]/h | INTRAVENOUS | Status: DC
  Filled 2018-10-01: qty 1000

## 2018-10-01 MED ORDER — LIDOCAINE HCL (PF) 1 % IJ SOLN: 30.0000 mL | INTRAMUSCULAR | Status: DC | PRN

## 2018-10-01 MED ORDER — OXYTOCIN 40 UNITS IN NORMAL SALINE INFUSION - SIMPLE MED: 2.5000 [IU]/h | INTRAVENOUS | Status: DC

## 2018-10-01 MED ORDER — PRENATAL MULTIVITAMIN CH
1.0000 | ORAL_TABLET | Freq: Every day | ORAL | Status: DC
  Administered 2018-10-02 – 2018-10-03 (×2): 1 via ORAL
  Filled 2018-10-01 (×2): qty 1

## 2018-10-01 MED ORDER — OXYCODONE HCL 5 MG PO TABS: 5.0000 mg | ORAL_TABLET | ORAL | Status: DC | PRN

## 2018-10-01 MED ORDER — PHENYLEPHRINE 40 MCG/ML (10ML) SYRINGE FOR IV PUSH (FOR BLOOD PRESSURE SUPPORT)
80.0000 ug | PREFILLED_SYRINGE | INTRAVENOUS | Status: DC | PRN
  Filled 2018-10-01: qty 10

## 2018-10-01 MED ORDER — DIPHENHYDRAMINE HCL 50 MG/ML IJ SOLN: 12.5000 mg | INTRAMUSCULAR | Status: DC | PRN

## 2018-10-01 MED ORDER — OXYCODONE-ACETAMINOPHEN 5-325 MG PO TABS: 1.0000 | ORAL_TABLET | ORAL | Status: DC | PRN

## 2018-10-01 MED ORDER — OXYTOCIN 40 UNITS IN NORMAL SALINE INFUSION - SIMPLE MED
1.0000 m[IU]/min | INTRAVENOUS | Status: DC
  Administered 2018-10-01: 2 m[IU]/min via INTRAVENOUS

## 2018-10-01 MED ORDER — ONDANSETRON HCL 4 MG/2ML IJ SOLN: 4.0000 mg | INTRAMUSCULAR | Status: DC | PRN

## 2018-10-01 NOTE — Anesthesia Procedure Notes (Signed)
Epidural Patient location during procedure: OB Start time: 10/01/2018 8:54 AM End time: 10/01/2018 9:07 AM  Staffing Anesthesiologist: Lidia Collum, MD Performed: anesthesiologist   Preanesthetic Checklist Completed: patient identified, pre-op evaluation, timeout performed, IV checked, risks and benefits discussed and monitors and equipment checked  Epidural Patient position: sitting Prep: DuraPrep Patient monitoring: heart rate, continuous pulse ox and blood pressure Approach: midline Location: L3-L4 Injection technique: LOR air  Needle:  Needle type: Tuohy  Needle gauge: 17 G Needle length: 9 cm Needle insertion depth: 5.5 cm Catheter type: closed end flexible Catheter size: 19 Gauge Catheter at skin depth: 11 cm Test dose: negative  Assessment Events: blood not aspirated, injection not painful, no injection resistance, negative IV test and no paresthesia  Additional Notes Reason for block:procedure for pain

## 2018-10-01 NOTE — H&P (Signed)
Suzanne Velez is a 23 y.o. female presenting for SROM at 40 weeks 5 days.  She doesn't feel strong contractions. OB History    Gravida  1   Para      Term      Preterm      AB      Living        SAB      TAB      Ectopic      Multiple      Live Births             Past Medical History:  Diagnosis Date  . Medical history non-contributory    Past Surgical History:  Procedure Laterality Date  . NO PAST SURGERIES     Family History: family history is not on file. Social History:  reports that she has never smoked. She has never used smokeless tobacco. She reports that she does not drink alcohol or use drugs.     Maternal Diabetes: No Genetic Screening: Declined Maternal Ultrasounds/Referrals: Declined Fetal Ultrasounds or other Referrals:  None Maternal Substance Abuse:  No Significant Maternal Medications:  None Significant Maternal Lab Results:  None Other Comments:  None  ROS History Dilation: 2.5 Effacement (%): 80 Station: -2 Exam by:: benji Production designer, theatre/television/film Blood pressure 133/83, pulse (!) 116, temperature 98.1 F (36.7 C), temperature source Oral, resp. rate 16, last menstrual period 12/20/2017. Exam Physical Exam  Her husband is interpreting for her as she refuses a different interpretor. Heart- rrr Lungs- CTAB Abd- benign, gravid Prenatal labs: ABO, Rh: --/--/O POS (09/05 0240) Antibody: NEG (09/05 0240) Rubella:  immune RPR:   negative HBsAg:   negative HIV:   negative GBS:   negative  Assessment/Plan: Language barrier Term pregnancy with SROM- no active labor yet Plan for admission to L&D  Expectant management   Reuven Braver C Nedra Mcinnis 10/01/2018, 3:59 AM

## 2018-10-01 NOTE — Discharge Summary (Signed)
Postpartum Discharge Summary      Patient Name: Suzanne Velez DOB: 03-18-1995 MRN: [id number]  Date of admission: 10/01/2018 Delivering Provider: Lattie Haw   Date of discharge: 10/03/2018  Admitting diagnosis: 40 wks water broke Intrauterine pregnancy: [redacted]w[redacted]d    Secondary diagnosis:  Active Problems:   Indication for care in labor and delivery, antepartum   Rupture of membranes with delay of delivery  Additional problems: n/a     Discharge diagnosis: Term Pregnancy Delivered                                                                                                Post partum procedures:n/a  Augmentation: Pitocin  Complications: None  Hospital course:  Onset of Labor With Vaginal Delivery     23y.o. yo G1P0 at 465w5das admitted in Latent Labor on 10/01/2018. Patient had an uncomplicated labor course as follows:  Membrane Rupture Time/Date: 12:15 AM ,10/01/2018  Patient progressed without augmentation to complete, added pitocin while pushing and pushed for 2 hours to deliver.  Intrapartum Procedures: Episiotomy: None [1]                                         Lacerations:  2nd degree [3];Labial [10];Periurethral [8]  Patient had a delivery of a Viable infant. 10/01/2018  Information for the patient's newborn:  AyKennah, Hehr0[[id number]]Delivery Method: Vag-Spont     Pateint had an uncomplicated postpartum course.  She is ambulating, tolerating a regular diet, passing flatus, and urinating well. Patient is discharged home in stable condition on 10/03/18.  Delivery time: 3:14 PM    Magnesium Sulfate received: No BMZ received: No Rhophylac:No MMR:No Transfusion:No  Physical exam  Vitals:   10/02/18 0517 10/02/18 1432 10/02/18 2104 10/03/18 0540  BP: (!) 111/58 116/72 121/68 126/77  Pulse: 69 72 77 79  Resp: 18 18 16 18   Temp: 97.6 F (36.4 C) 98 F (36.7 C) 97.8 F (36.6 C) (!) 97.5 F (36.4 C)  TempSrc: Oral Oral Oral Oral  SpO2: 98% 100%  98%    General: alert, cooperative and no distress Lochia: appropriate Uterine Fundus: firm Incision: N/A DVT Evaluation: No evidence of DVT seen on physical exam. Negative Homan's sign. No cords or calf tenderness. Labs: Lab Results  Component Value Date   WBC 13.0 (H) 10/01/2018   HGB 11.4 (L) 10/01/2018   HCT 35.0 (L) 10/01/2018   MCV 82.9 10/01/2018   PLT 226 10/01/2018   CMP Latest Ref Rng & Units 02/26/2018  Glucose 70 - 99 mg/dL 100(H)  BUN 6 - 20 mg/dL 6  Creatinine 0.44 - 1.00 mg/dL 0.42(L)  Sodium 135 - 145 mmol/L 133(L)  Potassium 3.5 - 5.1 mmol/L 3.4(L)  Chloride 98 - 111 mmol/L 101  CO2 22 - 32 mmol/L 19(L)  Calcium 8.9 - 10.3 mg/dL 9.2  Total Protein 6.5 - 8.1 g/dL 6.8  Total Bilirubin 0.3 - 1.2 mg/dL 0.3  Alkaline Phos 38 - 126 U/L  37(L)  AST 15 - 41 U/L 16  ALT 0 - 44 U/L 13    Discharge instruction: per After Visit Summary and "Baby and Me Booklet".  After visit meds:  Allergies as of 10/03/2018      Reactions   Pork-derived Products Other (See Comments)   Religion      Medication List    STOP taking these medications   acetaminophen 500 MG tablet Commonly known as: TYLENOL   esomeprazole 20 MG capsule Commonly known as: NEXIUM   Prenatal 27-1 MG Tabs   PRENATAL VITAMIN PO     TAKE these medications   ibuprofen 600 MG tablet Commonly known as: ADVIL Take 1 tablet (600 mg total) by mouth every 6 (six) hours.   norethindrone 0.35 MG tablet Commonly known as: MICRONOR Start on the Sunday after baby turns 21 weeks old       Diet: routine diet  Activity: Advance as tolerated. Pelvic rest for 6 weeks.   Outpatient follow up:6 weeks Follow up Appt: No future appointments. Follow up Visit: Follow-up Information    Department, [facility]. Schedule an appointment as soon as possible for a visit in 6 week(s).   Why: for postpartum checkup Contact information: [place] [address] [phone]           [facility] patient  Newborn Data: Live born female  Birth Weight:   APGAR: 83, 47  Newborn Delivery   Birth date/time: 10/01/2018 15:14:00 Delivery type: Vaginal, Spontaneous      Baby Feeding: Breast Disposition:home with mother

## 2018-10-01 NOTE — Lactation Note (Signed)
This note was copied from a baby's chart. Lactation Consultation Note  Patient Name: Suzanne Velez [id number] Date: 10/01/2018  P1, 5 hour female infant. LC entered room mom and infant asleep.   Maternal Data    Feeding    LATCH Score                   Interventions    Lactation Tools Discussed/Used     Consult Status      [name] 10/01/2018, 8:43 PM

## 2018-10-01 NOTE — Lactation Note (Addendum)
This note was copied from a baby's chart. Lactation Consultation Note  Patient Name: Suzanne Velez [id number] Date: 10/01/2018 Reason for consult: Initial assessment;1st time breastfeeding;Term P1, 7 hour female infant. Infant had 2 stools and one void since birth. Infant had three episodes of emesis infant has been spitty. Per Nurse, Dad signed paperwork to be used as Family interpreter. Per mom, she doesn't want a hand pump she will only exclusively breastfeed infant.   This is infant's third time latching to breast since birth, infant breastfed 5 minutes in L&D, first in room for 10 minutes. Mom latched infant on left breast using cross cradle hold, took few attempts at first but infant latched and sustained latch, swallows observed for 15 minutes. Mom was still breastfeeding when [facility] left the room. Mom knows to breastfeed according hunger cues, 8 to 12 times within 24 hours and on demand. Parents will continue to do STS. Mom knows to call Nurse or Mart[place]ll if she has any questions, concerns or need assistance with latching infant to breast. Mom made aware of O/P services, breastfeeding support groups, community resources, and our phone # for post-discharge questions.   Maternal Data Formula Feeding for Exclusion: Yes Reason for exclusion: Mother's choice to formula and breast feed on admission Has patient been taught Hand Expression?: Yes(Infant given 72ml of EBM by spoon.) Does the patient have breastfeeding experience prior to this delivery?: No  Feeding Feeding Type: Breast Fed  LATCH Score Latch: Grasps breast easily, tongue down, lips flanged, rhythmical sucking.  Audible Swallowing: Spontaneous and intermittent  Type of Nipple: Everted at rest and after stimulation  Comfort (Breast/Nipple): Soft / non-tender  Hold (Positioning): Assistance needed to correctly position infant at breast and maintain latch.  LATCH Score: 9  Interventions Interventions: Breast feeding basics  reviewed;Breast compression;Assisted with latch;Adjust position;Skin to skin;Support pillows;Breast massage;Position options;Hand express;Expressed milk  Lactation Tools Discussed/Used WIC Program: No   Consult Status Consult Status: Follow-up Date: 10/02/18 Follow-up type: In-patient    Suzanne Velez 10/01/2018, 11:02 PM

## 2018-10-01 NOTE — Progress Notes (Signed)
Suzanne Velez is a 23 y.o. female presenting for SROM at 40 weeks 5 days.  *Husband used as Software engineer*   Subjective: Comfortable, denies feeling contractions   Objective: BP (!) 104/48   Pulse 100   Temp 98.6 F (37 C) (Oral)   Resp 18   LMP 12/20/2017 (Exact Date)   SpO2 100%  No intake/output data recorded. No intake/output data recorded.  FHT:  FHR: 135 bpm, variability: moderate,  accelerations:  Present,  decelerations:  Present early decels  UC:   regular, every 2-5 minutes SVE:   Dilation: 4.5 Effacement (%): 70 Station: -1 Exam by:: [place]ade Nurse, RNC  Labs: Lab Results  Component Value Date   WBC 13.0 (H) 10/01/2018   HGB 11.4 (L) 10/01/2018   HCT 35.0 (L) 10/01/2018   MCV 82.9 10/01/2018   PLT 226 10/01/2018    Assessment / Plan: Suzanne Velez Naser Suzanne Velez is a 23 y.o. female presenting for SROM at 40 weeks 5 days.  Labor: Progressing well naturally. Making good cervical change. Recheck at 14:00 and consider AROM/pit. Preeclampsia:  no signs or symptoms of toxicity Fetal Wellbeing:  Category II close monitoring of fetal HR  Pain Control:  Epidural I/D:  n/a Anticipated MOD:  NSVD anticipated. Progress to csection if maternal/fetal indication   Lattie Haw MD PGY-1, [facility]  10/01/2018, 11:16 AM

## 2018-10-01 NOTE — Anesthesia Preprocedure Evaluation (Signed)

## 2018-10-02 ENCOUNTER — Other Ambulatory Visit (HOSPITAL_COMMUNITY)
Admission: RE | Admit: 2018-10-02 | Discharge: 2018-10-02 | Disposition: A | Discharge status: Home / Self Care | Payer: Medicaid Other | Source: Ambulatory Visit

## 2018-10-02 NOTE — Anesthesia Postprocedure Evaluation (Signed)
Anesthesia Post Note  Patient: Ronald Naser Abdelj Begay  Procedure(s) Performed: AN AD HOC LABOR EPIDURAL     Patient location during evaluation: Mother Baby Anesthesia Type: Epidural Level of consciousness: awake and alert, oriented and patient cooperative Pain management: pain level controlled Vital Signs Assessment: post-procedure vital signs reviewed and stable Respiratory status: spontaneous breathing Cardiovascular status: stable Postop Assessment: no headache, epidural receding, patient able to bend at knees, no signs of nausea or vomiting and able to ambulate Anesthetic complications: no Comments: Pain score 5.  Encouraged to communicate pain needs to RN.      Last Vitals:  Vitals:   10/01/18 2300 10/02/18 0517  BP: (!) 110/59 (!) 111/58  Pulse: 86 69  Resp: 18 18  Temp: 36.8 C 36.4 C  SpO2: 98% 98%    Last Pain:  Vitals:   10/02/18 0517  TempSrc: Oral  PainSc:    Pain Goal: Patients Stated Pain Goal: 0 (10/01/18 0111)                 Rico Sheehan

## 2018-10-02 NOTE — Progress Notes (Signed)
Joined lactation consultant in room who had brought up video interpreter.  Asked mom if she had any questions for me; also explained care plan for the evening.  Mom had no questions and expressed understanding.

## 2018-10-02 NOTE — Lactation Note (Signed)
This note was copied from a baby's chart. Lactation Consultation Note  Patient Name: Suzanne Velez [id number] Date: 10/02/2018 Reason for consult: Follow-up assessment;Term;1st time breastfeeding;Primapara  Used Environmental education officer services for Arabic interpreter [place] # [phone]  26 hours old FT female who is being mostly BF by her mother, she's a P1. She asked for formula this morning but no formula noted on the flowsheet yet; mom told LC baby "didn't like it". Dad has been the one doing all the interactions/communications with nursing staff and the one requesting the formula.   Mom was given formula supplementation guidelines according to baby's age in hours when Similac was brought to the room. She's also aware that if she starts supplementing with formula at home she should be pumping whenever baby is given a bottle with formula.  Offered assistance with latch but mom politely declined, she told LC that baby has fed; baby was asleep. Asked mom to call for assistance when needed. Reviewed normal newborn behavior, feeding cues and cluster feeding. Mom has been putting baby to the breast, [place] let mom know that baby might be cluster feeding tonight, she voiced understanding.   Feeding plan:  1. Encouraged to put baby to the breast on cues 8-12 times/24 hours or sooner if feeding cues are present.  2. Spoon feeding was also encouraged as well as pacifier use discouraged (mo had a pacifier in baby's bassinet).   Mom is aware of [place] OP services and will call PRN.   Maternal Data    Feeding    Interventions Interventions: Breast feeding basics reviewed;Hand express  Lactation Tools Discussed/Used     Consult Status Consult Status: Follow-up Date: 10/03/18 Follow-up type: In-patient    Suzanne Velez 10/02/2018, 8:13 PM

## 2018-10-02 NOTE — Progress Notes (Addendum)
Post Partum Day 1  Subjective: Husband translated into arabic for pt. Pt doing well. Has light abdo pain, motrin is helping. Breast feeding baby. Lochia is appropriate, used 6 X yellow pads. Passed 2-3 coin sized clots. Would like pills for contraception. Tolerating PO, mobilizing and voiding well. No BM or flatus yet. Not ready for discharge. Would like to stay another night.  Objective: Blood pressure (!) 111/58, pulse 69, temperature 97.6 F (36.4 C), temperature source Oral, resp. rate 18, last menstrual period 12/20/2017, SpO2 98 %, unknown if currently breastfeeding.  Physical Exam:  General: alert, cooperative and appears stated age  Cardiac: RRR, no rubs or gallops Pulm: lungs clear on ausc, no crackles or wheeze, no resp distress GI: Abdo mildly tender, no guarding, bowel sounds present  Lochia: appropriate Uterine Fundus: firm Incision: n/a DVT Evaluation: No cords or calf tenderness. No significant calf/ankle edema.  Recent Labs    10/01/18 0230  HGB 11.4*  HCT 35.0*    Assessment/Plan: Plan for discharge tomorrow  Encourage mobilization    LOS: 1 day   Poonam Patel 10/02/2018, 1:28 PM    GME ATTESTATION:  I saw and evaluated the patient. I agree with the findings and the plan of care as documented in the resident's note.  Morgan Keinath L, DO 10/02/2018 4:51 PM

## 2018-10-03 MED ORDER — IBUPROFEN 600 MG PO TABS: 600.0000 mg | ORAL_TABLET | Freq: Four times a day (QID) | ORAL | 0 refills | Status: DC

## 2018-10-03 MED ORDER — NORETHINDRONE 0.35 MG PO TABS: ORAL_TABLET | ORAL | 11 refills | Status: DC

## 2018-10-03 NOTE — Lactation Note (Signed)
This note was copied from a baby's chart. Lactation Consultation Note  Patient Name: Suzanne Velez [id number] Date: 10/03/2018 Reason for consult: Follow-up assessment;Primapara;1st time breastfeeding;Term;Infant weight loss;Other (Comment)(5% weight loss / at 38 hours Bili 9.0/ [place] interpreter - # [phone])  Baby is 72 hours old  Per Dr. Earle Gell baby is D/C today.  Per mom baby last fed at 7:40 for 20 mins , swallows and comfortable feeding.  LC reviewed basics and the importance of STS Feedings until the baby is  Back to birth weight , gaining steadily and can stay awake for majority of feeding. Breast feeding goal is to feed the baby at the breast 8-12 times day. Supply / demand discussed.  Moms feeding preference is Breast / Formula.  Mom denies sore ness. LC reviewed sore nipple and engorgement prevention and tx.  LC instructed mom on the use of the hand pump.  Mom aware of the [facility] resources post D/C and has the phone number.     Maternal Data Has patient been taught Hand Expression?: Yes(per mom feels comfortable)  Feeding Feeding Type: (pe rmom baby last fed at 7:45 for 20 mins)  LATCH Score                   Interventions Interventions: Breast feeding basics reviewed;Coconut oil;Hand pump  Lactation Tools Discussed/Used Tools: Pump Breast pump type: Manual Pump Review: Milk Storage;Setup, frequency, and cleaning Initiated by:: MAI Date initiated:: 10/03/18   Consult Status Consult Status: Complete Date: 10/03/18    Myer Haff 10/03/2018, 10:43 AM

## 2018-10-03 NOTE — Discharge Instructions (Signed)

## 2018-10-04 ENCOUNTER — Inpatient Hospital Stay (HOSPITAL_COMMUNITY): Payer: Medicaid Other

## 2018-10-04 ENCOUNTER — Inpatient Hospital Stay (HOSPITAL_COMMUNITY)
Admission: AD | Admit: 2018-10-04 | Payer: Medicaid Other | Source: Home / Self Care | Admitting: Obstetrics and Gynecology

## 2018-12-04 ENCOUNTER — Encounter (HOSPITAL_COMMUNITY): Payer: Self-pay

## 2018-12-04 ENCOUNTER — Other Ambulatory Visit: Payer: Self-pay

## 2018-12-04 ENCOUNTER — Emergency Department (HOSPITAL_COMMUNITY)
Admission: EM | Admit: 2018-12-04 | Discharge: 2018-12-04 | Disposition: A | Discharge status: Home / Self Care | Payer: Medicaid Other | Attending: Emergency Medicine | Admitting: Emergency Medicine

## 2018-12-04 DIAGNOSIS — N63 Unspecified lump in unspecified breast: Secondary | ICD-10-CM | POA: Diagnosis not present

## 2018-12-04 DIAGNOSIS — N939 Abnormal uterine and vaginal bleeding, unspecified: Secondary | ICD-10-CM | POA: Diagnosis present

## 2018-12-04 DIAGNOSIS — R102 Pelvic and perineal pain: Secondary | ICD-10-CM | POA: Insufficient documentation

## 2018-12-04 DIAGNOSIS — N3000 Acute cystitis without hematuria: Secondary | ICD-10-CM

## 2018-12-04 LAB — WET PREP, GENITAL
Clue Cells Wet Prep HPF POC: NONE SEEN
Sperm: NONE SEEN
Trich, Wet Prep: NONE SEEN
Yeast Wet Prep HPF POC: NONE SEEN

## 2018-12-04 LAB — URINALYSIS, ROUTINE W REFLEX MICROSCOPIC
Bilirubin Urine: NEGATIVE
Glucose, UA: NEGATIVE mg/dL
Ketones, ur: NEGATIVE mg/dL
Nitrite: NEGATIVE
Protein, ur: NEGATIVE mg/dL
Specific Gravity, Urine: 1.012 (ref 1.005–1.030)
pH: 7 (ref 5.0–8.0)

## 2018-12-04 LAB — PREGNANCY, URINE: Preg Test, Ur: NEGATIVE

## 2018-12-04 MED ORDER — CEPHALEXIN 500 MG PO CAPS: 500.0000 mg | ORAL_CAPSULE | Freq: Two times a day (BID) | ORAL | 0 refills | Status: AC

## 2018-12-04 NOTE — ED Provider Notes (Signed)
[facility] Provider Note   CSN: [id number] Arrival date & time: 12/04/18  1628     History   Chief Complaint No chief complaint on file.   HPI Suzanne Velez is a 23 y.o. female.     HPI Suzanne Velez is a 23 y.o. female presents to emergency department with complaint of vaginal bleeding and breast mass to the left breast.  Patient is status post vaginal delivery on 10/01/2018.  Patient states ever since then she has had mild vaginal spotting that has gotten worse since starting on birth control 3 weeks ago.  She states spotting is white, comes and goes.  She reports associated lower abdominal cramping.  She denies any urinary symptoms.  She denies any heavy vaginal bleeding or any vaginal discharge.  She has not been sexually active since birth.  She does breast-feed.  She denies any fever or chills.  Patient is also complaining of a nodule to the left breast.  Patient first noticed it while pregnant with her child.  She states that she did show it to her OB/GYN who wanted to wait until she delivered her child to have a mammogram.  Patient states she has been calling her doctor to schedule mammogram but has not heard back from them.  Nodule is nontender.  It has not changed in the last several months.  She denies any lymphadenopathy to her axilla.  She denies any nipple drainage other than milk.  Past Medical History:  Diagnosis Date  . Medical history non-contributory     Patient Active Problem List   Diagnosis Date Noted  . Indication for care in labor and delivery, antepartum 10/01/2018  . Rupture of membranes with delay of delivery 10/01/2018    Past Surgical History:  Procedure Laterality Date  . NO PAST SURGERIES       OB History    Gravida  1   Para  1   Term  1   Preterm      AB      Living  1     SAB      TAB      Ectopic      Multiple  0   Live Births  1            Home Medications     Prior to Admission medications   Medication Sig Start Date End Date Taking? Authorizing Provider  ibuprofen (ADVIL) 600 MG tablet Take 1 tablet (600 mg total) by mouth every 6 (six) hours. 10/03/18   Cresenzo-Dishmon, Scarlette CalicoFrances, CNM  norethindrone (MICRONOR) 0.35 MG tablet Start on the Sunday after baby turns 23 weeks old 10/03/18   Jacklyn Shellresenzo-Dishmon, Frances, CNM    Family History Family History  Problem Relation Age of Onset  . Diabetes Paternal Uncle     Social History Social History   Tobacco Use  . Smoking status: Never Smoker  . Smokeless tobacco: Never Used  Substance Use Topics  . Alcohol use: Never    Frequency: Never  . Drug use: Never     Allergies   Pork-derived products   Review of Systems Review of Systems  Constitutional: Negative for chills and fever.  Respiratory: Negative for cough, chest tightness and shortness of breath.   Cardiovascular: Negative for chest pain, palpitations and leg swelling.  Gastrointestinal: Positive for abdominal pain. Negative for diarrhea, nausea and vomiting.  Genitourinary: Positive for pelvic pain, vaginal bleeding and vaginal pain. Negative for  dysuria, flank pain and vaginal discharge.  Musculoskeletal: Negative for arthralgias, myalgias, neck pain and neck stiffness.  Skin: Negative for rash.  Neurological: Negative for dizziness, weakness and headaches.  All other systems reviewed and are negative.    Physical Exam Updated Vital Signs BP 111/70 (BP Location: Right Arm)   Pulse 70   Temp 97.8 F (36.6 C) (Oral)   Resp 16   LMP 12/20/2017 (Exact Date)   SpO2 98%   Physical Exam Vitals signs and nursing note reviewed.  Constitutional:      General: She is not in acute distress.    Appearance: She is well-developed.  HENT:     Head: Normocephalic.  Eyes:     Conjunctiva/sclera: Conjunctivae normal.  Neck:     Musculoskeletal: Neck supple.  Cardiovascular:     Rate and Rhythm: Normal rate and regular rhythm.      Heart sounds: Normal heart sounds.  Pulmonary:     Effort: Pulmonary effort is normal. No respiratory distress.     Breath sounds: Normal breath sounds. No wheezing or rales.  Abdominal:     General: Bowel sounds are normal. There is no distension.     Palpations: Abdomen is soft.     Tenderness: There is no abdominal tenderness. There is no rebound.  Genitourinary:    Comments: Small, approximately 1 cm in diameter, round, mobile nodule in the left lateral breast.  Nontender.  No lymphadenopathy to the axilla. Normal external genitalia. Normal vaginal canal. Small thin white discharge. Cervix is normal, closed. No CMT. No uterine or adnexal tenderness. No masses palpated.   Skin:    General: Skin is warm and dry.  Neurological:     Mental Status: She is alert.  Psychiatric:        Behavior: Behavior normal.      ED Treatments / Results  Labs (all labs ordered are listed, but only abnormal results are displayed) Labs Reviewed  WET PREP, GENITAL - Abnormal; Notable for the following components:      Result Value   WBC, Wet Prep HPF POC MODERATE (*)    All other components within normal limits  URINALYSIS, ROUTINE W REFLEX MICROSCOPIC - Abnormal; Notable for the following components:   Hgb urine dipstick SMALL (*)    Leukocytes,Ua MODERATE (*)    Bacteria, UA RARE (*)    All other components within normal limits  PREGNANCY, URINE  POC URINE PREG, ED  GC/CHLAMYDIA PROBE AMP ([place]) NOT AT [facility]    EKG None  Radiology No results found.  Procedures Procedures (including critical care time)  Medications Ordered in ED Medications - No data to display   Initial Impression / Assessment and Plan / ED Course  I have reviewed the triage vital signs and the nursing notes.  Pertinent labs & imaging results that were available during my care of the patient were reviewed by me and considered in my medical decision making (see chart for details).        Patient  in emergency department with vaginal spotting.  She is status post vaginal delivery 2 months ago.  Her vaginal exam is really unremarkable.  There is no bleeding on my exam.  There is no cervical motion tenderness.  She is slightly sore on external genitalia but vaginal exam is normal.  Her urine however did show signs of UTI.  This could explain her abdominal cramping.  I will start her on Keflex for UTI.  I did discuss with  her that she should follow-up with her OB/GYN if she continues to have spotting.  It is possible that her spotting is from starting birth control pills 3 weeks ago.  I have also given her phone number for breast center regarding her breast mass.  I explained to her her doctor has to order her mammogram however.  She will follow-up with them.  At this point she is stable for discharge home.  No other complaints.  Vitals:   12/04/18 1629 12/04/18 2009  BP: 111/70 (!) 92/47  Pulse: 70   Resp: 16 17  Temp: 97.8 F (36.6 C) 97.7 F (36.5 C)  TempSrc: Oral Oral  SpO2: 98% 97%     Final Clinical Impressions(s) / ED Diagnoses   Final diagnoses:  Vaginal spotting  Acute cystitis without hematuria  Breast mass    ED Discharge Orders         Ordered    cephALEXin (KEFLEX) 500 MG capsule  2 times daily     12/04/18 2001           Jaynie Crumble, PA-C 12/04/18 2054    Alvira Monday, MD 12/06/18 1302

## 2018-12-04 NOTE — ED Triage Notes (Signed)
Patient here with vaginal bleeding described as mild and vaginal pressure after giving birth 2 months ago. Also wants raised area on left breast checked, has had since pregnancy. NAD

## 2018-12-06 LAB — GC/CHLAMYDIA PROBE AMP (~~LOC~~) NOT AT ARMC
Chlamydia: NEGATIVE
Neisseria Gonorrhea: NEGATIVE

## 2018-12-15 ENCOUNTER — Ambulatory Visit: Payer: Medicaid Other

## 2018-12-15 ENCOUNTER — Other Ambulatory Visit: Payer: Self-pay

## 2018-12-15 ENCOUNTER — Other Ambulatory Visit: Payer: Self-pay | Admitting: Obstetrics and Gynecology

## 2018-12-15 ENCOUNTER — Ambulatory Visit
Admission: RE | Admit: 2018-12-15 | Discharge: 2018-12-15 | Disposition: A | Discharge status: Home / Self Care | Payer: Medicaid Other | Source: Ambulatory Visit | Attending: Obstetrics and Gynecology | Admitting: Obstetrics and Gynecology

## 2018-12-15 DIAGNOSIS — N6320 Unspecified lump in the left breast, unspecified quadrant: Secondary | ICD-10-CM

## 2019-02-09 ENCOUNTER — Encounter (HOSPITAL_COMMUNITY): Payer: Self-pay | Admitting: Obstetrics & Gynecology

## 2019-06-02 ENCOUNTER — Other Ambulatory Visit: Payer: Self-pay | Admitting: Obstetrics and Gynecology

## 2019-06-15 ENCOUNTER — Ambulatory Visit
Admission: RE | Admit: 2019-06-15 | Discharge: 2019-06-15 | Disposition: A | Discharge status: Home / Self Care | Payer: Medicaid Other | Source: Ambulatory Visit | Attending: Obstetrics and Gynecology | Admitting: Obstetrics and Gynecology

## 2019-06-15 ENCOUNTER — Other Ambulatory Visit: Payer: Self-pay

## 2019-06-15 DIAGNOSIS — N6320 Unspecified lump in the left breast, unspecified quadrant: Secondary | ICD-10-CM

## 2019-11-23 ENCOUNTER — Other Ambulatory Visit: Payer: Self-pay | Admitting: Family

## 2019-11-23 DIAGNOSIS — Z3682 Encounter for antenatal screening for nuchal translucency: Secondary | ICD-10-CM

## 2019-11-23 LAB — OB RESULTS CONSOLE HEPATITIS B SURFACE ANTIGEN: Hepatitis B Surface Ag: NEGATIVE

## 2019-11-23 LAB — OB RESULTS CONSOLE HGB/HCT, BLOOD: Hemoglobin: 12.5

## 2019-11-23 LAB — OB RESULTS CONSOLE PLATELET COUNT: Platelets: 292

## 2019-11-23 LAB — OB RESULTS CONSOLE GC/CHLAMYDIA
Chlamydia: NEGATIVE
Gonorrhea: NEGATIVE

## 2019-11-23 LAB — OB RESULTS CONSOLE HIV ANTIBODY (ROUTINE TESTING): HIV: NONREACTIVE

## 2019-11-23 LAB — HEPATITIS C ANTIBODY: HCV Ab: NEGATIVE

## 2019-11-23 LAB — OB RESULTS CONSOLE RUBELLA ANTIBODY, IGM: Rubella: IMMUNE

## 2019-11-23 LAB — OB RESULTS CONSOLE VARICELLA ZOSTER ANTIBODY, IGG: Varicella: IMMUNE

## 2019-12-06 ENCOUNTER — Encounter: Payer: Self-pay | Admitting: *Deleted

## 2019-12-11 ENCOUNTER — Ambulatory Visit: Payer: Medicaid Other

## 2020-01-27 NOTE — L&D Delivery Note (Signed)
Patient: Suzanne Velez MRN: [id number]  GBS status: Negative, IAP given: None   Patient is a 25 y.o. now G2P2 s/p NSVD at [redacted]w[redacted]d, who was admitted for postdates, SOL. AROM 2h 37m prior to delivery with clear fluid.    Delivery Note At 12:38 PM a viable female was delivered via Vaginal, Spontaneous (Presentation:   Occiput Anterior).  APGAR: 8, 9; weight pending.   Placenta status: Spontaneous, Intact.  Cord: 3 vessels with the following complications: Loose nuchal x1.  Cord pH: N/A  Anesthesia: Epidural Episiotomy: None Lacerations: 2nd degree Suture Repair: 3.0 vicryl rapide Est. Blood Loss (mL): 260   Head delivered OA. Shoulder and body delivered in usual fashion. Loose nuchal noted after delivery, easily reduced. Infant with spontaneous cry, placed on mother's abdomen, dried and bulb suctioned. Cord clamped x 2 after 1-minute delay, and cut by RN. Cord blood drawn. Placenta delivered spontaneously with gentle cord traction. Fundus firm with massage and Pitocin. Perineum inspected and found to have 2nd degree laceration, which was repaired with 3.0 vicryl rapide with good hemostasis achieved.  Mom to postpartum.  Baby to Couplet care / Skin to Skin.  De Hollingshead 06/25/2020, 1:04 PM

## 2020-03-19 IMAGING — US ULTRASOUND LEFT BREAST LIMITED
1 series · 11 of 11 positions shown · non-contrast
Comparison: None.

CLINICAL DATA: 31-year-old female presenting for evaluation of a
palpable lump in the lateral left breast identified about 1 month
ago. She feels that this is definitely a new change. Patient is 29
weeks pregnant. No family history of breast cancer.

EXAM:
ULTRASOUND OF THE LEFT BREAST

[Series 1: ultrasound left breast limited · 0.06mm/px · 11 of 11 slices shown]
[im 1/11]
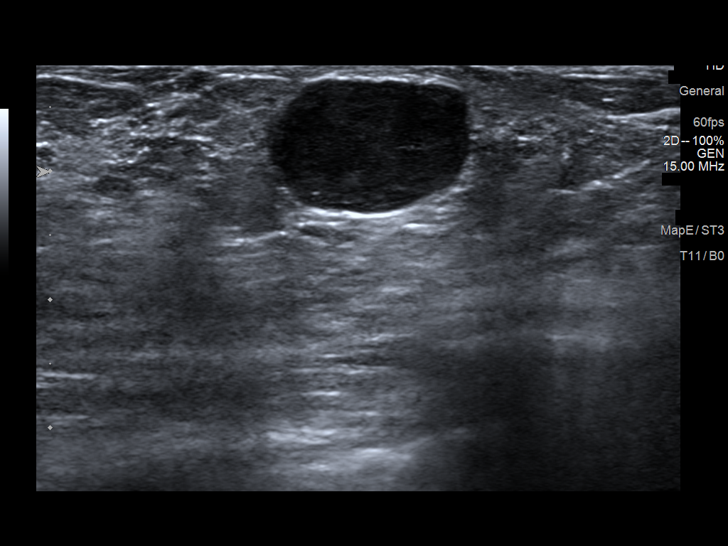
[im 2/11]
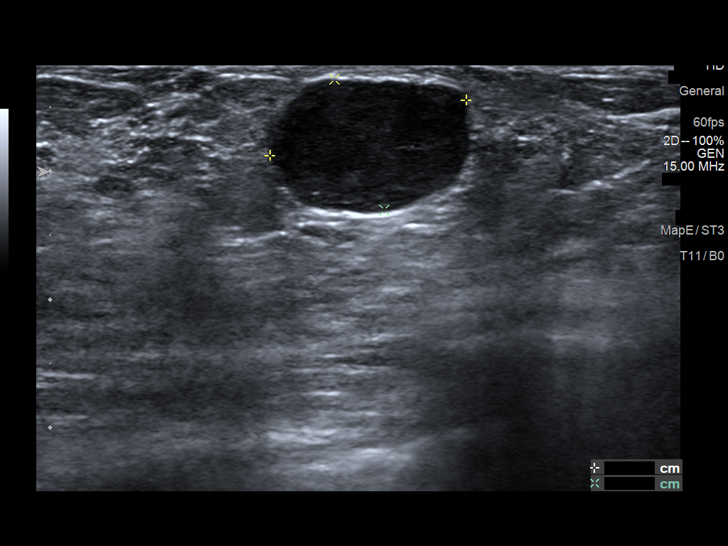
[im 3/11]
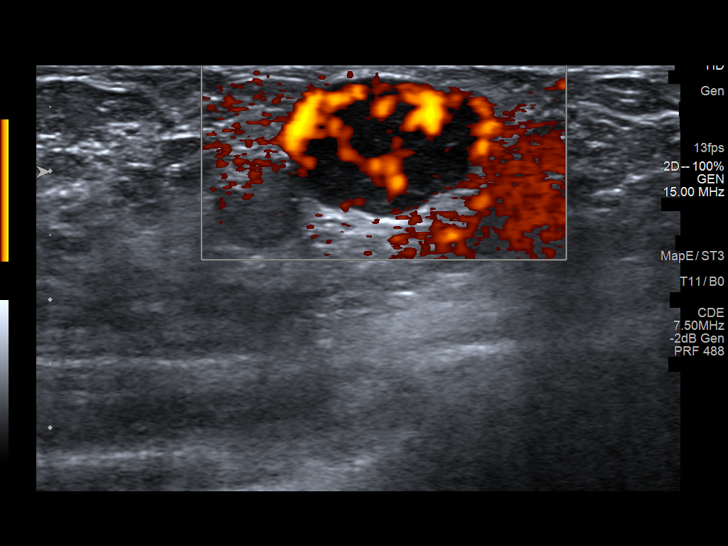
[im 4/11]
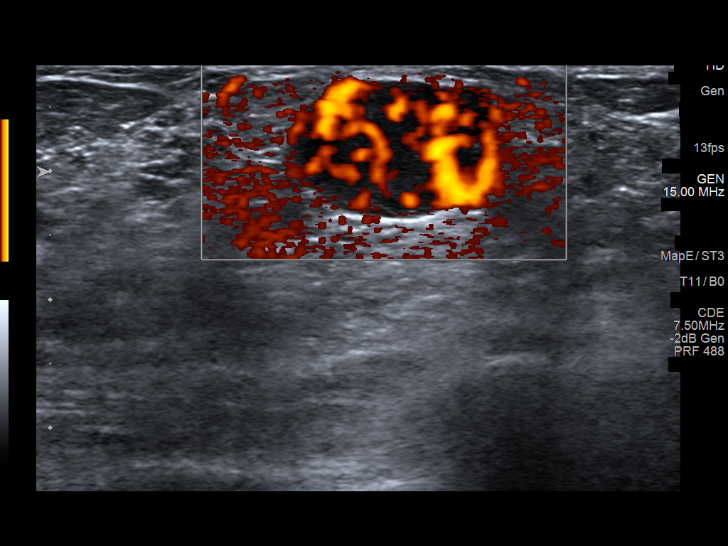
[im 5/11]
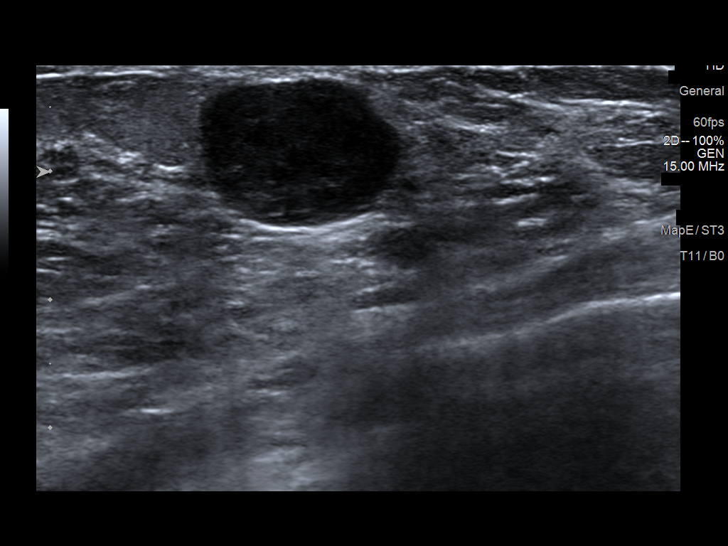
[im 6/11]
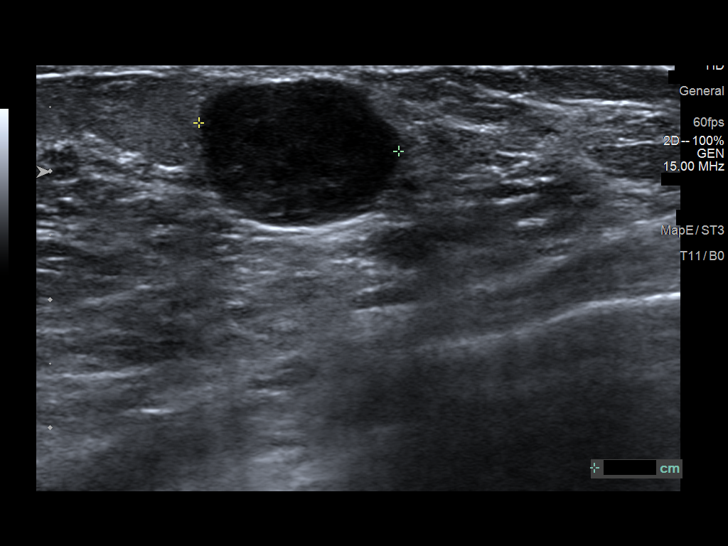
[im 7/11]
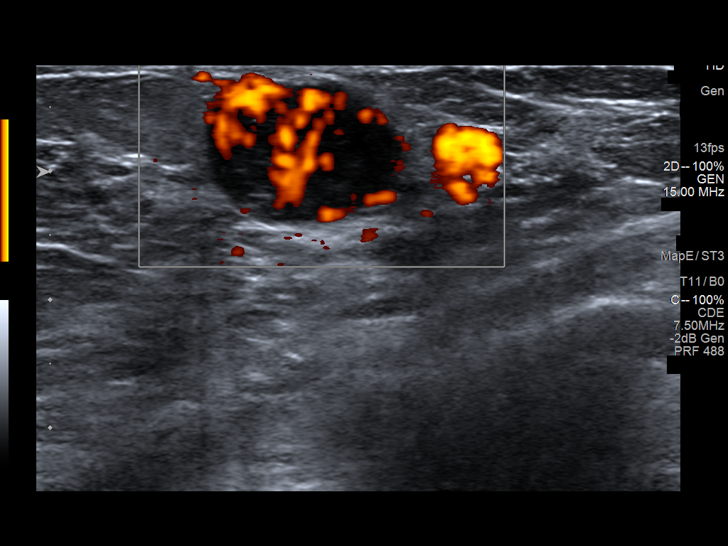
[im 8/11]
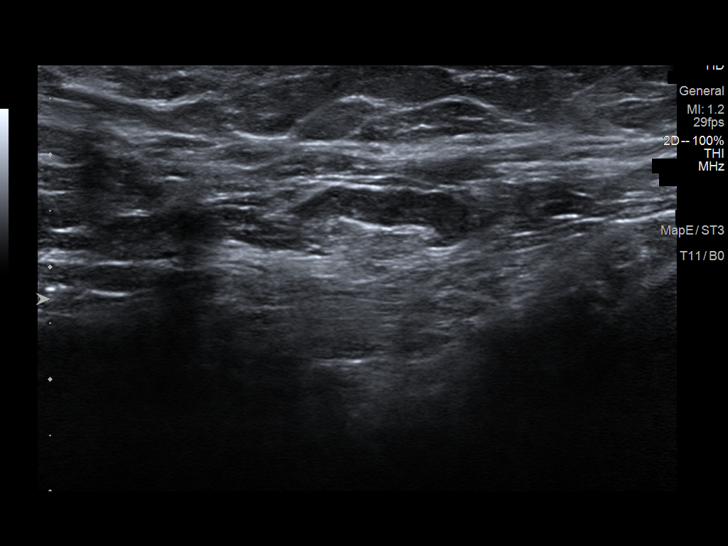
[im 9/11]
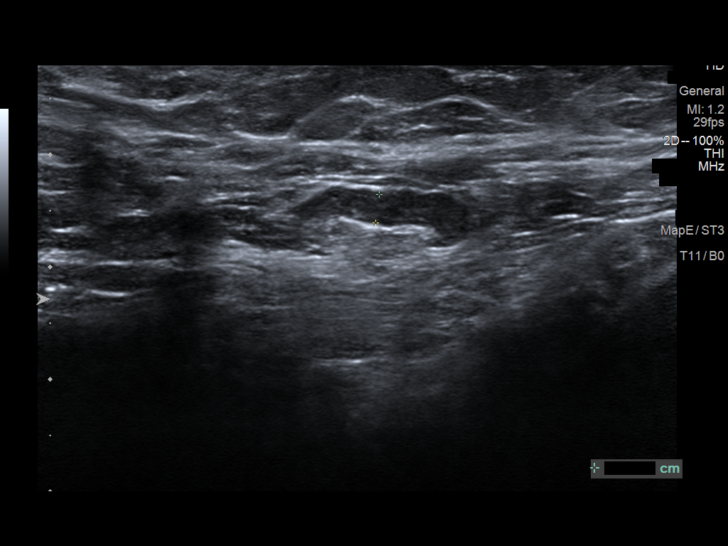
[im 10/11]
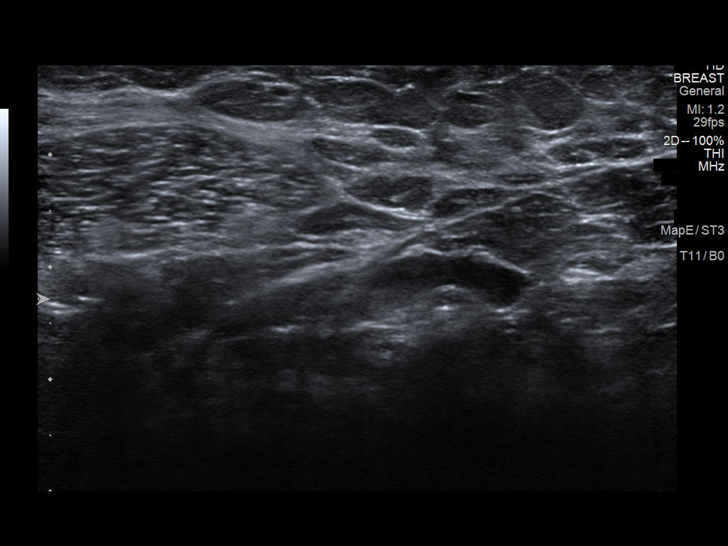
[im 11/11]
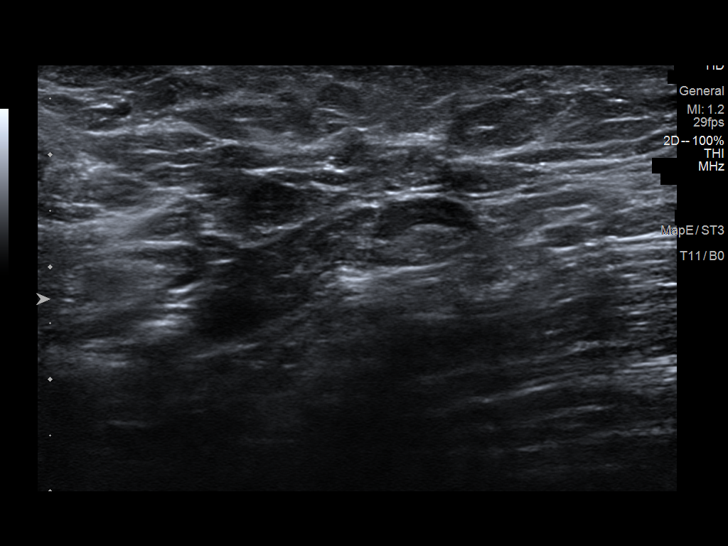

[11 of 11 positions shown; findings below may reference images not displayed]

FINDINGS: On physical exam, there is a firm mobile mass in the lower outer
left breast.

Targeted ultrasound is performed, showing a circumscribed oval
hypoechoic superficial mass measuring 1.6 x 1.1 x 1.6 cm. The mass
is highly vascular on color Doppler imaging. No suspicious lymph
nodes are identified in the left axilla.
IMPRESSION: 1. There is a newly palpable lump in the lower outer left breast.
While this is likely a fibroadenoma or a lactating adenoma, given
that this is new, biopsy is recommended.

2.  No evidence of left axillary lymphadenopathy.

RECOMMENDATION:
Ultrasound guided biopsy is recommended for the palpable left breast
mass.

I have discussed the findings and recommendations with the patient.
Results were also provided in writing at the conclusion of the
visit. If applicable, a reminder letter will be sent to the patient
regarding the next appointment.

BI-RADS CATEGORY  4: Suspicious.

## 2020-04-21 ENCOUNTER — Other Ambulatory Visit: Payer: Self-pay

## 2020-04-21 ENCOUNTER — Inpatient Hospital Stay (HOSPITAL_COMMUNITY)
Admission: AD | Admit: 2020-04-21 | Discharge: 2020-04-21 | Disposition: A | Discharge status: Home / Self Care | Payer: Medicaid Other | Attending: Obstetrics & Gynecology | Admitting: Obstetrics & Gynecology

## 2020-04-21 ENCOUNTER — Encounter (HOSPITAL_COMMUNITY): Payer: Self-pay | Admitting: Obstetrics & Gynecology

## 2020-04-21 DIAGNOSIS — K219 Gastro-esophageal reflux disease without esophagitis: Secondary | ICD-10-CM

## 2020-04-21 DIAGNOSIS — R42 Dizziness and giddiness: Secondary | ICD-10-CM | POA: Diagnosis not present

## 2020-04-21 DIAGNOSIS — O26893 Other specified pregnancy related conditions, third trimester: Secondary | ICD-10-CM | POA: Diagnosis not present

## 2020-04-21 DIAGNOSIS — R079 Chest pain, unspecified: Secondary | ICD-10-CM | POA: Insufficient documentation

## 2020-04-21 DIAGNOSIS — R12 Heartburn: Secondary | ICD-10-CM | POA: Diagnosis not present

## 2020-04-21 DIAGNOSIS — Z3A32 32 weeks gestation of pregnancy: Secondary | ICD-10-CM

## 2020-04-21 DIAGNOSIS — Z79899 Other long term (current) drug therapy: Secondary | ICD-10-CM | POA: Diagnosis not present

## 2020-04-21 DIAGNOSIS — O99613 Diseases of the digestive system complicating pregnancy, third trimester: Secondary | ICD-10-CM | POA: Diagnosis not present

## 2020-04-21 MED ORDER — FERROUS SULFATE 325 (65 FE) MG PO TABS: 325.0000 mg | ORAL_TABLET | Freq: Every day | ORAL | 3 refills | Status: DC

## 2020-04-21 MED ORDER — ALUM & MAG HYDROXIDE-SIMETH 200-200-20 MG/5ML PO SUSP
30.0000 mL | Freq: Once | ORAL | Status: AC
  Administered 2020-04-21: 30 mL via ORAL
  Filled 2020-04-21: qty 30

## 2020-04-21 MED ORDER — LIDOCAINE VISCOUS HCL 2 % MT SOLN
15.0000 mL | Freq: Once | OROMUCOSAL | Status: AC
  Administered 2020-04-21: 15 mL via ORAL
  Filled 2020-04-21: qty 15

## 2020-04-21 NOTE — MAU Note (Signed)
Pt reports improvement in discomfort since GI cocktail.

## 2020-04-21 NOTE — MAU Note (Addendum)
Pt husband interpreting   Pt reports chest pain started yesterday. Started epigastric and radiated to left armpit. Pain went away and came back on today. Pt reports pain 8/10. Pt reports currently no chest pain just slight discomfort. Denies LOF, VB, and ctx. Reports +FM.

## 2020-04-21 NOTE — MAU Provider Note (Signed)
History     CSN: [id number]  Arrival date and time: 04/21/20 2135   Event Date/Time   First Provider Initiated Contact with Patient 04/21/20 2158      Chief Complaint  Patient presents with  . Chest Pain   HPI Suzanne Velez is a 25 y.o. G2P1001 at [redacted]w[redacted]d who presents with burning in her chest. She states it happened yesterday and today. She reports yesterday it happened while she was drinking coffee and then improved. Today it happened while she was eating lunch. She denies any pain at this time but reports soreness in her chest. She points to the substernal area of her chest. She states when it happened, she also felt short of breath and dizzy. She is not currently having any of the symptoms. She denies any abdominal pain, vaginal bleeding or discharge. She reports normal fetal movement.   OB History    Gravida  2   Para  1   Term  1   Preterm      AB      Living  1     SAB      IAB      Ectopic      Multiple  0   Live Births  1           Past Medical History:  Diagnosis Date  . Anemia   . Medical history non-contributory     Past Surgical History:  Procedure Laterality Date  . NO PAST SURGERIES      Family History  Problem Relation Age of Onset  . Diabetes Paternal Uncle     Social History   Tobacco Use  . Smoking status: Never Smoker  . Smokeless tobacco: Never Used  Vaping Use  . Vaping Use: Never used  Substance Use Topics  . Alcohol use: Never  . Drug use: Never    Allergies:  Allergies  Allergen Reactions  . Pork-Derived Products Other (See Comments)    Religion    Medications Prior to Admission  Medication Sig Dispense Refill Last Dose  . Prenatal Vit-Fe Fumarate-FA (MULTIVITAMIN-PRENATAL) 27-0.8 MG TABS tablet Take 1 tablet by mouth daily at 12 noon.   04/21/2020 at Unknown time  . ibuprofen (ADVIL) 600 MG tablet Take 1 tablet (600 mg total) by mouth every 6 (six) hours. 30 tablet 0   . norethindrone (MICRONOR)  0.35 MG tablet Start on the Sunday after baby turns 39 weeks old 1 Package 11     Review of Systems  Constitutional: Negative.  Negative for fatigue and fever.  HENT: Negative.   Respiratory: Positive for shortness of breath. Negative for chest tightness.   Cardiovascular: Negative.  Negative for chest pain.  Gastrointestinal: Negative.  Negative for abdominal pain, constipation, diarrhea, nausea and vomiting.  Genitourinary: Negative.  Negative for dysuria, vaginal bleeding and vaginal discharge.  Neurological: Positive for dizziness. Negative for headaches.   Physical Exam   Temperature 98.1 F (36.7 C), temperature source Oral, resp. rate 18, last menstrual period 09/10/2019, unknown if currently breastfeeding.  Physical Exam Vitals and nursing note reviewed.  Constitutional:      General: She is not in acute distress.    Appearance: She is well-developed.  HENT:     Head: Normocephalic.  Eyes:     Pupils: Pupils are equal, round, and reactive to light.  Cardiovascular:     Rate and Rhythm: Normal rate and regular rhythm.     Heart sounds: Normal heart sounds.  Pulmonary:  Effort: Pulmonary effort is normal. No respiratory distress.     Breath sounds: Normal breath sounds.  Abdominal:     General: Bowel sounds are normal. There is no distension.     Palpations: Abdomen is soft.     Tenderness: There is no abdominal tenderness.  Skin:    General: Skin is warm and dry.  Neurological:     Mental Status: She is alert and oriented to person, place, and time.  Psychiatric:        Behavior: Behavior normal.        Thought Content: Thought content normal.        Judgment: Judgment normal.    Fetal Tracing:  Baseline: 140 Variability: moderate Accels: 15x15 Decels: none   Toco: none   MAU Course  Procedures  MDM Prenatal records from private office reviewed. Pregnancy uncomplicated. Labs ordered and reviewed.  NST reactive ED EKG- normal sinus rhythm GI  cocktail- patient reports complete resolution of pain.  Patient is already taking heartburn medication but sporadically. Encouraged patient to take it daily and discussed foods to help manage persistent heartburn. Patient requested refill of iron supplement.   Assessment and Plan   1. Heartburn during pregnancy in third trimester   2. [redacted] weeks gestation of pregnancy    -Discharge home in stable condition -Rx for ferrous sulfate sent to patient's pharmacy -Heartburn precautions discussed -Patient advised to follow-up with OB as scheduled for prenatal care -Patient may return to MAU as needed or if her condition were to change or worsen   Rolm Bookbinder CNM 04/21/2020, 9:58 PM

## 2020-04-21 NOTE — Discharge Instructions (Signed)
Heartburn During Pregnancy Heartburn is a type of pain or discomfort that can happen in the throat or chest. It is often described as a burning sensation. Heartburn is common during pregnancy because:  Progesterone, a hormone that is released during pregnancy, may relax the valve that separates the esophagus from the stomach (lower esophageal sphincter, or LES). This allows stomach acid to move up into the esophagus, causing heartburn.  The uterus gets larger and pushes up on the stomach, which pushes more acid into the esophagus. This is especially true in the later stages of pregnancy. Heartburn usually goes away or gets better after giving birth. What are the causes? This condition is caused by stomach acid backing up into the esophagus (reflux). Reflux can be triggered by:  Changing hormone levels.  Large meals.  Certain foods and beverages, such as coffee, chocolate, onions, and peppermint.  Exercise.  Increased stomach acid production. What increases the risk? You are more likely to develop this condition if:  You had heartburn prior to becoming pregnant.  You have been pregnant more than once before.  You are overweight or obese. The likelihood that you will get heartburn also increases as you get further along in your pregnancy, especially during the last trimester. What are the signs or symptoms? Symptoms of this condition include:  Burning pain in the chest or lower throat.  A bitter taste in the mouth.  Coughing.  Problems swallowing.  Vomiting.  A hoarse voice.  Asthma. Symptoms may get worse when you lie down or bend over. Symptoms are often worse at night. How is this diagnosed? This condition is diagnosed based on:  Your medical history.  Your symptoms.  Blood tests to check for a certain type of bacteria that is associated with heartburn.  Whether taking heartburn medicine relieves your symptoms.  An examination of the stomach and esophagus  using a tube that has a light and camera (endoscopy). How is this treated? Treatment for this condition depends on how severe your symptoms are. Your health care provider may recommend:  Over-the-counter medicines for mild heartburn, such as antacids or acid reducers.  Prescription medicines to decrease stomach acid or to protect your stomach lining.  Certain changes in your diet.  Raising the head of your bed so it is higher than the foot of the bed. This helps prevent stomach acid from backing up into the esophagus when you are lying down. Follow these instructions at home: Eating and drinking  Do not drink alcohol during your pregnancy.  Identify foods and beverages that make your symptoms worse and avoid them.  Eat small, frequent meals instead of large meals.  Avoid drinking large amounts of liquid with your meals.  Avoid eating meals during the 2-3 hours before bedtime.  Avoid lying down right after you eat.  Do not exercise right after you eat. Beverages to avoid  Coffee and tea, with or without caffeine.  Energy drinks and sports drinks.  Carbonated drinks or sodas.  Citrus fruit juices. Foods to avoid  Chocolate and cocoa.  Peppermint and mint flavorings.  Garlic, onions, and horseradish.  Spicy and acidic foods, including peppers, chili powder, curry powder, vinegar, hot sauces, and barbecue sauce.  Citrus fruits, such as oranges, lemons, and limes.  Tomato-based foods, such as red sauce, chili, and salsa.  Fried and fatty foods, such as donuts, french fries, potato chips, and high-fat dressings.  High-fat meats, such as hot dogs, precooked or cured meat, sausage, ham, and bacon.    High-fat dairy items, such as whole milk, butter, and cheese. Medicines  Take over-the-counter and prescription medicines only as told by your health care provider.  Do not take aspirin or NSAIDs, such as ibuprofen, unless your health care provider tells you to take  them.  You may be instructed to avoid medicines that contain sodium bicarbonate. General instructions  If directed, raise the head of your bed about 6 inches (15 cm) by putting blocks under the legs. Sleeping with more pillows does not effectively relieve heartburn because it only changes the position of your head.  Do not use any products that contain nicotine or tobacco, such as cigarettes, e-cigarettes, and chewing tobacco. If you need help quitting, ask your health care provider.  Wear loose-fitting clothing.  Try to reduce your stress, such as with yoga or meditation. If you need help managing stress, ask your health care provider.  Maintain a healthy weight. If you are overweight, work with your health care provider to safely manage your weight.  Keep all follow-up visits as told by your health care provider. This is important. Where to find more information  American Pregnancy Association: americanpregnancy.org Contact a health care provider if:  Your symptoms do not improve with treatment, or you develop new symptoms.  You have unexplained weight loss.  You have difficulty swallowing.  You make loud sounds when you breathe (wheeze).  You have a cough that does not go away.  You have frequent heartburn for more than 2 weeks.  You have nausea or vomiting that does not get better with treatment.  You have pain in your abdomen. Get help right away if:  You have severe chest pain that spreads to your arm, neck, or jaw.  You feel sweaty, dizzy, or light-headed.  You have shortness of breath.  You have pain when swallowing.  You vomit, and your vomit looks like blood or coffee grounds.  Your stool is bloody or black. Summary  Heartburn in pregnancy is common, especially during the last trimester.  This condition is caused by stomach acid backing up into the esophagus (reflux).  This condition can be treated with medicines, changes to your diet, or elevating the  head of your bed.  Keep all follow-up visits as told by your health care provider. This is important. This information is not intended to replace advice given to you by your health care provider. Make sure you discuss any questions you have with your health care provider. Document Revised: 10/05/2018 Document Reviewed: 10/05/2018 Elsevier Patient Education  2021 Elsevier Inc.  Safe Medications in Pregnancy   Acne: Benzoyl Peroxide Salicylic Acid  Backache/Headache: Tylenol: 2 regular strength every 4 hours OR              2 Extra strength every 6 hours  Colds/Coughs/Allergies: Benadryl (alcohol free) 25 mg every 6 hours as needed Breath right strips Claritin Cepacol throat lozenges Chloraseptic throat spray Cold-Eeze- up to three times per day Cough drops, alcohol free Flonase (by prescription only) Guaifenesin Mucinex Robitussin DM (plain only, alcohol free) Saline nasal spray/drops Sudafed (pseudoephedrine) & Actifed ** use only after [redacted] weeks gestation and if you do not have high blood pressure Tylenol Vicks Vaporub Zinc lozenges Zyrtec   Constipation: Colace Ducolax suppositories Fleet enema Glycerin suppositories Metamucil Milk of magnesia Miralax Senokot Smooth move tea  Diarrhea: Kaopectate Imodium A-D  *NO pepto Bismol  Hemorrhoids: Anusol Anusol HC Preparation H Tucks  Indigestion: Tums Maalox Mylanta Zantac  Pepcid  Insomnia: Benadryl (alcohol  free) 25mg  every 6 hours as needed Tylenol PM Unisom, no Gelcaps  Leg Cramps: Tums MagGel  Nausea/Vomiting:  Bonine Dramamine Emetrol Ginger extract Sea bands Meclizine  Nausea medication to take during pregnancy:  Unisom (doxylamine succinate 25 mg tablets) Take one tablet daily at bedtime. If symptoms are not adequately controlled, the dose can be increased to a maximum recommended dose of two tablets daily (1/2 tablet in the morning, 1/2 tablet mid-afternoon and one at  bedtime). Vitamin B6 100mg  tablets. Take one tablet twice a day (up to 200 mg per day).  Skin Rashes: Aveeno products Benadryl cream or 25mg  every 6 hours as needed Calamine Lotion 1% cortisone cream  Yeast infection: Gyne-lotrimin 7 Monistat 7   **If taking multiple medications, please check labels to avoid duplicating the same active ingredients **take medication as directed on the label ** Do not exceed 4000 mg of tylenol in 24 hours **Do not take medications that contain aspirin or ibuprofen

## 2020-06-22 ENCOUNTER — Other Ambulatory Visit: Payer: Self-pay | Admitting: Advanced Practice Midwife

## 2020-06-24 ENCOUNTER — Encounter (HOSPITAL_COMMUNITY): Payer: Self-pay | Admitting: Obstetrics and Gynecology

## 2020-06-24 ENCOUNTER — Inpatient Hospital Stay (HOSPITAL_COMMUNITY)
Admission: AD | Admit: 2020-06-24 | Discharge: 2020-06-26 | DRG: 807 | Disposition: A | Discharge status: Home / Self Care | Payer: Medicaid Other | Attending: Family Medicine | Admitting: Family Medicine

## 2020-06-24 ENCOUNTER — Other Ambulatory Visit: Payer: Self-pay

## 2020-06-24 DIAGNOSIS — Z3A41 41 weeks gestation of pregnancy: Secondary | ICD-10-CM

## 2020-06-24 DIAGNOSIS — Z20822 Contact with and (suspected) exposure to covid-19: Secondary | ICD-10-CM | POA: Diagnosis present

## 2020-06-24 DIAGNOSIS — O480 Post-term pregnancy: Principal | ICD-10-CM | POA: Diagnosis present

## 2020-06-24 NOTE — MAU Note (Signed)
Pt reports ctx started earlier this afternoon. Have gotten stronger, 2-4 min apart. Denies LOF and VB, reports +FM.  GBS neg

## 2020-06-25 ENCOUNTER — Encounter (HOSPITAL_COMMUNITY): Payer: Self-pay | Admitting: Obstetrics and Gynecology

## 2020-06-25 ENCOUNTER — Other Ambulatory Visit: Payer: Self-pay

## 2020-06-25 ENCOUNTER — Inpatient Hospital Stay (HOSPITAL_COMMUNITY): Payer: Medicaid Other | Admitting: Anesthesiology

## 2020-06-25 DIAGNOSIS — O480 Post-term pregnancy: Secondary | ICD-10-CM | POA: Diagnosis present

## 2020-06-25 DIAGNOSIS — O99355 Diseases of the nervous system complicating the puerperium: Secondary | ICD-10-CM | POA: Diagnosis not present

## 2020-06-25 DIAGNOSIS — G8918 Other acute postprocedural pain: Secondary | ICD-10-CM | POA: Diagnosis not present

## 2020-06-25 DIAGNOSIS — Z20822 Contact with and (suspected) exposure to covid-19: Secondary | ICD-10-CM | POA: Diagnosis present

## 2020-06-25 DIAGNOSIS — Z3A41 41 weeks gestation of pregnancy: Secondary | ICD-10-CM | POA: Diagnosis not present

## 2020-06-25 LAB — CBC
HCT: 36.9 % (ref 36.0–46.0)
Hemoglobin: 12.9 g/dL (ref 12.0–15.0)
MCH: 30.4 pg (ref 26.0–34.0)
MCHC: 35 g/dL (ref 30.0–36.0)
MCV: 87 fL (ref 80.0–100.0)
Platelets: 216 10*3/uL (ref 150–400)
RBC: 4.24 MIL/uL (ref 3.87–5.11)
RDW: 13.5 % (ref 11.5–15.5)
WBC: 13.4 10*3/uL — ABNORMAL HIGH (ref 4.0–10.5)
nRBC: 0 % (ref 0.0–0.2)

## 2020-06-25 LAB — TYPE AND SCREEN
ABO/RH(D): O POS
Antibody Screen: NEGATIVE

## 2020-06-25 LAB — RESP PANEL BY RT-PCR (FLU A&B, COVID) ARPGX2
Influenza A by PCR: NEGATIVE
Influenza B by PCR: NEGATIVE
SARS Coronavirus 2 by RT PCR: NEGATIVE

## 2020-06-25 LAB — RPR: RPR Ser Ql: NONREACTIVE

## 2020-06-25 MED ORDER — EPHEDRINE 5 MG/ML INJ: 10.0000 mg | INTRAVENOUS | Status: DC | PRN

## 2020-06-25 MED ORDER — ZOLPIDEM TARTRATE 5 MG PO TABS: 5.0000 mg | ORAL_TABLET | Freq: Every evening | ORAL | Status: DC | PRN

## 2020-06-25 MED ORDER — PHENYLEPHRINE 40 MCG/ML (10ML) SYRINGE FOR IV PUSH (FOR BLOOD PRESSURE SUPPORT): 80.0000 ug | PREFILLED_SYRINGE | INTRAVENOUS | Status: DC | PRN

## 2020-06-25 MED ORDER — DIPHENHYDRAMINE HCL 50 MG/ML IJ SOLN: 12.5000 mg | INTRAMUSCULAR | Status: DC | PRN

## 2020-06-25 MED ORDER — FENTANYL-BUPIVACAINE-NACL 0.5-0.125-0.9 MG/250ML-% EP SOLN
12.0000 mL/h | EPIDURAL | Status: DC | PRN
  Administered 2020-06-25: 12 mL/h via EPIDURAL
  Filled 2020-06-25: qty 250

## 2020-06-25 MED ORDER — LIDOCAINE HCL (PF) 1 % IJ SOLN: 30.0000 mL | INTRAMUSCULAR | Status: DC | PRN

## 2020-06-25 MED ORDER — OXYTOCIN-SODIUM CHLORIDE 30-0.9 UT/500ML-% IV SOLN
2.5000 [IU]/h | INTRAVENOUS | Status: DC
  Filled 2020-06-25: qty 500

## 2020-06-25 MED ORDER — ACETAMINOPHEN 325 MG PO TABS
650.0000 mg | ORAL_TABLET | ORAL | Status: DC | PRN
Start: 2020-06-25 — End: 2020-06-25

## 2020-06-25 MED ORDER — LIDOCAINE-EPINEPHRINE (PF) 2 %-1:200000 IJ SOLN
INTRAMUSCULAR | Status: DC | PRN
  Administered 2020-06-25: 4 mL via EPIDURAL

## 2020-06-25 MED ORDER — LACTATED RINGERS IV SOLN: 500.0000 mL | Freq: Once | INTRAVENOUS | Status: DC

## 2020-06-25 MED ORDER — ONDANSETRON HCL 4 MG/2ML IJ SOLN
4.0000 mg | Freq: Four times a day (QID) | INTRAMUSCULAR | Status: DC | PRN
  Administered 2020-06-25: 4 mg via INTRAVENOUS
  Filled 2020-06-25: qty 2

## 2020-06-25 MED ORDER — OXYTOCIN BOLUS FROM INFUSION
333.0000 mL | Freq: Once | INTRAVENOUS | Status: AC
  Administered 2020-06-25: 333 mL via INTRAVENOUS

## 2020-06-25 MED ORDER — OXYCODONE-ACETAMINOPHEN 5-325 MG PO TABS: 1.0000 | ORAL_TABLET | ORAL | Status: DC | PRN

## 2020-06-25 MED ORDER — IBUPROFEN 600 MG PO TABS
600.0000 mg | ORAL_TABLET | Freq: Four times a day (QID) | ORAL | Status: DC
  Administered 2020-06-25 – 2020-06-26 (×5): 600 mg via ORAL
  Filled 2020-06-25 (×6): qty 1

## 2020-06-25 MED ORDER — FENTANYL CITRATE (PF) 100 MCG/2ML IJ SOLN: 100.0000 ug | INTRAMUSCULAR | Status: DC | PRN

## 2020-06-25 MED ORDER — LACTATED RINGERS IV SOLN
500.0000 mL | INTRAVENOUS | Status: DC | PRN
  Administered 2020-06-25: 500 mL via INTRAVENOUS

## 2020-06-25 MED ORDER — LACTATED RINGERS IV SOLN: INTRAVENOUS | Status: DC

## 2020-06-25 MED ORDER — OXYCODONE-ACETAMINOPHEN 5-325 MG PO TABS: 2.0000 | ORAL_TABLET | ORAL | Status: DC | PRN

## 2020-06-25 MED ORDER — SOD CITRATE-CITRIC ACID 500-334 MG/5ML PO SOLN: 30.0000 mL | ORAL | Status: DC | PRN

## 2020-06-25 MED ORDER — MISOPROSTOL 25 MCG QUARTER TABLET: 25.0000 ug | ORAL_TABLET | ORAL | Status: DC | PRN

## 2020-06-25 MED ORDER — TERBUTALINE SULFATE 1 MG/ML IJ SOLN: 0.2500 mg | Freq: Once | INTRAMUSCULAR | Status: DC | PRN

## 2020-06-25 NOTE — H&P (Signed)
OBSTETRIC ADMISSION HISTORY AND PHYSICAL  Suzanne Velez is a 25 y.o. female G2P1001 with IUP at [redacted]w[redacted]d by LMP presenting for post-date spontaneous onset of labor. She reports +FMs, No LOF, no VB, no blurry vision, headaches or peripheral edema, and RUQ pain.  She plans on breast feeding. She is undecided for birth control.  She received her prenatal care at [facility]   Dating: By LMP --->  Estimated Date of Delivery: 06/16/20  Sono:  @[redacted]w[redacted]d , CWD, normal anatomy per [facility] records, cephalic presentation,  62% EFW  Prenatal History/Complications:  - post-dates - Language barrier - anemia  Past Medical History: Past Medical History:  Diagnosis Date  . Anemia   . Medical history non-contributory     Past Surgical History: Past Surgical History:  Procedure Laterality Date  . NO PAST SURGERIES      Obstetrical History: OB History    Gravida  2   Para  1   Term  1   Preterm      AB      Living  1     SAB      IAB      Ectopic      Multiple  0   Live Births  1           Social History Social History   Socioeconomic History  . Marital status: Married    Spouse name: Not on file  . Number of children: Not on file  . Years of education: Not on file  . Highest education level: Not on file  Occupational History  . Not on file  Tobacco Use  . Smoking status: Never Smoker  . Smokeless tobacco: Never Used  Vaping Use  . Vaping Use: Never used  Substance and Sexual Activity  . Alcohol use: Never  . Drug use: Never  . Sexual activity: Yes    Comment: last sex 3 weeks ago  Other Topics Concern  . Not on file  Social History Narrative  . Not on file   Social Determinants of Health   Financial Resource Strain: Not on file  Food Insecurity: Not on file  Transportation Needs: Not on file  Physical Activity: Not on file  Stress: Not on file  Social Connections: Not on file    Family History: Family History  Problem Relation Age of Onset  .  Diabetes Paternal Uncle     Allergies: Allergies  Allergen Reactions  . Pork-Derived Products Other (See Comments)    Religion    Medications Prior to Admission  Medication Sig Dispense Refill Last Dose  . ferrous sulfate 325 (65 FE) MG tablet Take 1 tablet (325 mg total) by mouth daily. 30 tablet 3   . Prenatal Vit-Fe Fumarate-FA (MULTIVITAMIN-PRENATAL) 27-0.8 MG TABS tablet Take 1 tablet by mouth daily at 12 noon.        Review of Systems   All systems reviewed and negative except as stated in HPI  Blood pressure 104/60, pulse 78, last menstrual period 09/10/2019, SpO2 100 %, unknown if currently breastfeeding. General appearance: alert, cooperative and appears stated age Lungs: normal WOB Heart: regular rate  Abdomen: soft, non-tender Extremities: no sign of DVT Presentation: cephalic Fetal monitoringBaseline: 120 bpm, Variability: Good {> 6 bpm), Accelerations: Reactive and Decelerations: Absent Uterine activityFrequency: Every 4 minutes Dilation: 5.5 Effacement (%): 80 Station: 0 Exam by:: Dr. [email or web address]  Prenatal labs: ABO, Rh: --/--/O POS (05/31 0052)O+ Antibody: NEG (05/31 0052)negative Rubella:  immune RPR:   non-reactive  HBsAg:   non-reactive HIV:   non-reactive GBS:   negative 1 hr Glucola wnl Genetic screening normal CF screen, declined quad screen Anatomy US wnl per [facility] records  Prenatal Transfer Tool  Maternal Diabetes: No Genetic Screening: normal CF screen, declined quad screen Maternal Ultrasounds/Referrals: Normal per [facility] records Fetal Ultrasounds or other Referrals:  None Maternal Substance Abuse:  No Significant Maternal Medications:  None Significant Maternal Lab Results: Group B Strep negative  Results for orders placed or performed during the hospital encounter of 06/24/20 (from the past 24 hour(s))  Resp Panel by RT-PCR (Flu A&B, Covid) Nasopharyngeal Swab   Collection Time: 06/25/20 12:51 AM   Specimen: Nasopharyngeal Swab;  Nasopharyngeal(NP) swabs in vial transport medium  Result Value Ref Range   SARS Coronavirus 2 by RT PCR NEGATIVE NEGATIVE   Influenza A by PCR NEGATIVE NEGATIVE   Influenza B by PCR NEGATIVE NEGATIVE  CBC   Collection Time: 06/25/20 12:51 AM  Result Value Ref Range   WBC 13.4 (H) 4.0 - 10.5 K/uL   RBC 4.24 3.87 - 5.11 MIL/uL   Hemoglobin 12.9 12.0 - 15.0 g/dL   HCT 21.3 08.6 - 57.8 %   MCV 87.0 80.0 - 100.0 fL   MCH 30.4 26.0 - 34.0 pg   MCHC 35.0 30.0 - 36.0 g/dL   RDW 46.9 62.9 - 52.8 %   Platelets 216 150 - 400 K/uL   nRBC 0.0 0.0 - 0.2 %  Type and screen   Collection Time: 06/25/20 12:52 AM  Result Value Ref Range   ABO/RH(D) O POS    Antibody Screen NEG    Sample Expiration      06/28/2020,2359 Performed at [facility], [address], [place], [place] [address]     Patient Active Problem List   Diagnosis Date Noted  . Post term pregnancy over 40 weeks 06/25/2020  . Indication for care in labor and delivery, antepartum 10/01/2018  . Rupture of membranes with delay of delivery 10/01/2018    Assessment/Plan:  Suzanne Velez is a 25 y.o. G2P1001 at [redacted]w[redacted]d here for post-dates spontaneous onset of labor.  #Labor: Will manage expectantly and augment as clinically indicated. #Pain: TBD per pt request #FWB: Category 1 strip #ID: GBS negative #MOF: breast #MOC: undecided; considering pills #Circ: n/a  Sheila Oats, MD OB Fellow, Faculty [facility] 06/25/2020 4:25 AM

## 2020-06-25 NOTE — Progress Notes (Signed)
LABOR PROGRESS NOTE  Suzanne Velez Suzanne Velez is a 25 y.o. G2P1001 at [redacted]w[redacted]d  admitted for latent labor, post dates.   Subjective: Patient resting comfortably. Husband at bedside. No concerns.   Objective: BP (!) 105/55   Pulse 76   Temp 98.6 F (37 C) (Oral)   Resp 16   LMP 09/10/2019 (Exact Date)   SpO2 100%  or  Vitals:   06/25/20 0831 06/25/20 0901 06/25/20 0931 06/25/20 1001  BP: (!) 112/57 (!) 96/49 (!) 108/51 (!) 105/55  Pulse: 86 82 78 76  Resp: 18 18 18 16   Temp:      TempSrc:      SpO2:       Dilation: 7.5 Effacement (%): 80 Cervical Position: Middle Station: 0 Presentation: Vertex Exam by:: cat Saddie Velez FHT: baseline rate 125, moderate varibility, +acel, no decel Toco: q4 min   Labs: Lab Results  Component Value Date   WBC 13.4 (H) 06/25/2020   HGB 12.9 06/25/2020   HCT 36.9 06/25/2020   MCV 87.0 06/25/2020   PLT 216 06/25/2020    Patient Active Problem List   Diagnosis Date Noted  . Post term pregnancy over 40 weeks 06/25/2020  . Indication for care in labor and delivery, antepartum 10/01/2018  . Rupture of membranes with delay of delivery 10/01/2018    Assessment / Plan: 25 y.o. G2P1001 at [redacted]w[redacted]d here for latent labor, postdates.  Labor: AROM performed with scant clear fluid. Continue expectant management.  Fetal Wellbeing:  Cat I  Pain Control:  Epidural in place  Anticipated MOD:  NSVD   [redacted]w[redacted]d, D.O. OB Fellow  06/25/2020, 10:20 AM

## 2020-06-25 NOTE — Anesthesia Procedure Notes (Signed)
Epidural Patient location during procedure: OB Start time: 06/25/2020 1:35 AM End time: 06/25/2020 1:45 AM  Staffing Anesthesiologist: Elmer Picker, MD Performed: anesthesiologist   Preanesthetic Checklist Completed: patient identified, IV checked, risks and benefits discussed, monitors and equipment checked, pre-op evaluation and timeout performed  Epidural Patient position: sitting Prep: DuraPrep and site prepped and draped Patient monitoring: continuous pulse ox, blood pressure, heart rate and cardiac monitor Approach: midline Location: L3-L4 Injection technique: LOR air  Needle:  Needle type: Tuohy  Needle gauge: 17 G Needle length: 9 cm Needle insertion depth: 4 cm Catheter type: closed end flexible Catheter size: 19 Gauge Catheter at skin depth: 10 cm Test dose: negative  Assessment Sensory level: T8 Events: blood not aspirated, injection not painful, no injection resistance, no paresthesia and negative IV test  Additional Notes Patient identified. Risks/Benefits/Options discussed with patient including but not limited to bleeding, infection, nerve damage, paralysis, failed block, incomplete pain control, headache, blood pressure changes, nausea, vomiting, reactions to medication both or allergic, itching and postpartum back pain. Confirmed with bedside nurse the patient's most recent platelet count. Confirmed with patient that they are not currently taking any anticoagulation, have any bleeding history or any family history of bleeding disorders. Patient expressed understanding and wished to proceed. All questions were answered. Sterile technique was used throughout the entire procedure. Please see nursing notes for vital signs. Test dose was given through epidural catheter and negative prior to continuing to dose epidural or start infusion. Warning signs of high block given to the patient including shortness of breath, tingling/numbness in hands, complete motor block,  or any concerning symptoms with instructions to call for help. Patient was given instructions on fall risk and not to get out of bed. All questions and concerns addressed with instructions to call with any issues or inadequate analgesia.  Reason for block:procedure for pain

## 2020-06-25 NOTE — Discharge Summary (Addendum)
Postpartum Discharge Summary  Date of Service updated 06/26/2020     Patient Name: Suzanne Velez DOB: 08/19/1995 MRN: [id number]  Date of admission: 06/24/2020 Delivery date:06/25/2020  Delivering provider: Nicolette Bang  Date of discharge: 06/26/2020  Admitting diagnosis: Post term pregnancy over 40 weeks [O48.0] Intrauterine pregnancy: [redacted]w[redacted]d    Secondary diagnosis:  Active Problems:   Post term pregnancy over 40 weeks  Additional problems: none    Discharge diagnosis: Term Pregnancy Delivered                                              Post partum procedures:none Augmentation: AROM Complications: None  Hospital course: Onset of Labor With Vaginal Delivery      25y.o. yo G2P1001 at 23w2das admitted in Latent Labor on 06/24/2020. Patient had an uncomplicated labor course as follows:  Membrane Rupture Time/Date: 9:48 AM ,06/25/2020   Delivery Method:Vaginal, Spontaneous  Episiotomy: None  Lacerations:  2nd degree  Patient had an uncomplicated postpartum course.  She is ambulating, tolerating a regular diet, passing flatus, and urinating well. Patient is discharged home in stable condition on 06/26/20.  Newborn Data: Birth date:06/25/2020  Birth time:12:38 PM  Gender:Female  Living status:Living  Apgars:8 ,9  Weight:3609 g   Magnesium Sulfate received: No BMZ received: No Rhophylac:N/A MMR:N/A T-DaP:Given prenatally Flu: No Transfusion:No  Physical exam  Vitals:   06/25/20 2324 06/26/20 0409 06/26/20 1332 06/26/20 2100  BP: (!) 102/58 (!) 94/55 104/70 99/65  Pulse: 60 66 65 61  Resp:  17 16 16   Temp: 97.8 F (36.6 C) 98.5 F (36.9 C) 97.7 F (36.5 C) 97.7 F (36.5 C)  TempSrc: Oral Oral Oral Oral  SpO2: 100% 99%  100%   General: alert, cooperative and no distress Lochia: appropriate Uterine Fundus: firm Incision: N/A DVT Evaluation: No evidence of DVT seen on physical exam. Labs: Lab Results  Component Value Date   WBC 13.4  (H) 06/25/2020   HGB 12.9 06/25/2020   HCT 36.9 06/25/2020   MCV 87.0 06/25/2020   PLT 216 06/25/2020   CMP Latest Ref Rng & Units 02/26/2018  Glucose 70 - 99 mg/dL 100(H)  BUN 6 - 20 mg/dL 6  Creatinine 0.44 - 1.00 mg/dL 0.42(L)  Sodium 135 - 145 mmol/L 133(L)  Potassium 3.5 - 5.1 mmol/L 3.4(L)  Chloride 98 - 111 mmol/L 101  CO2 22 - 32 mmol/L 19(L)  Calcium 8.9 - 10.3 mg/dL 9.2  Total Protein 6.5 - 8.1 g/dL 6.8  Total Bilirubin 0.3 - 1.2 mg/dL 0.3  Alkaline Phos 38 - 126 U/L 37(L)  AST 15 - 41 U/L 16  ALT 0 - 44 U/L 13   Edinburgh Score: Edinburgh Postnatal Depression Scale Screening Tool 06/26/2020  I have been able to laugh and see the funny side of things. 0  I have looked forward with enjoyment to things. 0  I have blamed myself unnecessarily when things went wrong. 0  I have been anxious or worried for no good reason. 0  I have felt scared or panicky for no good reason. 0  Things have been getting on top of me. 0  I have been so unhappy that I have had difficulty sleeping. 0  I have felt sad or miserable. 0  I have been so unhappy that I have been crying. 0  The thought of harming myself has occurred to me. 0  Edinburgh Postnatal Depression Scale Total 0     After visit meds:  Allergies as of 06/26/2020      Reactions   Pork-derived Products Other (See Comments)   Religion      Medication List    STOP taking these medications   ferrous sulfate 325 (65 FE) MG tablet     TAKE these medications   acetaminophen 325 MG tablet Commonly known as: Tylenol Take 2 tablets (650 mg total) by mouth every 4 (four) hours as needed (for pain scale < 4).   ibuprofen 600 MG tablet Commonly known as: ADVIL Take 1 tablet (600 mg total) by mouth every 6 (six) hours as needed.   multivitamin-prenatal 27-0.8 MG Tabs tablet Take 1 tablet by mouth daily at 12 noon.   norethindrone 0.35 MG tablet Commonly known as: Ortho Micronor Take 1 tablet (0.35 mg total) by mouth daily.         Discharge home in stable condition Infant Feeding: Breast Infant Disposition:home with mother Discharge instruction: per After Visit Summary and Postpartum booklet. Activity: Advance as tolerated. Pelvic rest for 6 weeks.  Diet: routine diet Future Appointments:No future appointments. Follow up Visit:  Follow-up Information    Department, [facility]. Schedule an appointment as soon as possible for a visit.   Why: Make appt with Health Department in 6 weeks Contact information: [address] [address] [phone]                Please schedule this patient for a Virtual postpartum visit in 6 weeks with the following provider: Any provider. Low risk pregnancy complicated by: anemia   Delivery mode:  Vaginal, Spontaneous  Anticipated Birth Control:  POPs   06/26/2020 Mee Hives, MD   I saw and evaluated the patient. I agree with the findings and the plan of care as documented in the resident's note.  Sharene Skeans, MD [facility] Family [facility] Fellow, [facility] for [facility], [place]

## 2020-06-25 NOTE — Progress Notes (Signed)
Labor Progress Note Adiana Naser Nakyiah Kuck is a 25 y.o. G2P1001 at [redacted]w[redacted]d presented for latent labor, post-dates.  S: Pt resting comfortably with epidural in place. No concerns at this time.  O:  BP 104/60   Pulse 78   LMP 09/10/2019 (Exact Date)   SpO2 100%  EFM: baseline 130/moderate variability/+accels/no decels Toco: contractions every 3-5 min  CVE: Dilation: 5.5 Effacement (%): 80 Cervical Position: Middle Station: 0 Presentation: Vertex Exam by:: Dr. Barb Merino   A&P: 25 y.o. G2P1001 [redacted]w[redacted]d presented for latent labor, post-dates. #Labor: Progressing well. Pt prefers to defer AROM at this time.  #Pain: Epidural in place #FWB: Category 1 strip #GBS negative  Sheila Oats, MD 4:25 AM

## 2020-06-25 NOTE — Anesthesia Preprocedure Evaluation (Signed)

## 2020-06-25 NOTE — Lactation Note (Signed)
This note was copied from a baby's chart. Lactation Consultation Note LC to L&D for initial visit. This experienced mother latched her baby independently during our visit. We reviewed feeding norms on first day.  Patient Name: Suzanne Velez [id number] Date: 06/25/2020 Reason for consult: L&D Initial assessment Age:23 hours  Maternal Data Does the patient have breastfeeding experience prior to this delivery?: Yes How long did the patient breastfeed?: 6 mo Normal breast development and symmetry   Feeding Mother's Current Feeding Choice: Breast Milk  LATCH Score Latch: Grasps breast easily, tongue down, lips flanged, rhythmical sucking.  Audible Swallowing: Spontaneous and intermittent  Type of Nipple: Everted at rest and after stimulation  Comfort (Breast/Nipple): Soft / non-tender  Hold (Positioning): No assistance needed to correctly position infant at breast.  LATCH Score: 10    Interventions Interventions: Education;Breast feeding basics reviewed;Support pillows   Consult Status Consult Status: Follow-up Follow-up type: In-patient   Elder Negus, MA IBCLC 06/25/2020, 1:38 PM

## 2020-06-26 ENCOUNTER — Inpatient Hospital Stay (HOSPITAL_COMMUNITY): Payer: Medicaid Other

## 2020-06-26 ENCOUNTER — Inpatient Hospital Stay (HOSPITAL_COMMUNITY)
Admission: AD | Admit: 2020-06-26 | Payer: Medicaid Other | Source: Home / Self Care | Admitting: Obstetrics & Gynecology

## 2020-06-26 ENCOUNTER — Encounter (HOSPITAL_COMMUNITY): Payer: Self-pay | Admitting: Obstetrics and Gynecology

## 2020-06-26 DIAGNOSIS — G8918 Other acute postprocedural pain: Secondary | ICD-10-CM

## 2020-06-26 DIAGNOSIS — O99355 Diseases of the nervous system complicating the puerperium: Secondary | ICD-10-CM

## 2020-06-26 MED ORDER — SIMETHICONE 80 MG PO CHEW: 80.0000 mg | CHEWABLE_TABLET | ORAL | Status: DC | PRN

## 2020-06-26 MED ORDER — TETANUS-DIPHTH-ACELL PERTUSSIS 5-2.5-18.5 LF-MCG/0.5 IM SUSY: 0.5000 mL | PREFILLED_SYRINGE | Freq: Once | INTRAMUSCULAR | Status: DC

## 2020-06-26 MED ORDER — BENZOCAINE-MENTHOL 20-0.5 % EX AERO: 1.0000 "application " | INHALATION_SPRAY | CUTANEOUS | Status: DC | PRN

## 2020-06-26 MED ORDER — ACETAMINOPHEN 325 MG PO TABS
650.0000 mg | ORAL_TABLET | ORAL | Status: AC | PRN
Start: 2020-06-26 — End: ?

## 2020-06-26 MED ORDER — IBUPROFEN 600 MG PO TABS: 600.0000 mg | ORAL_TABLET | Freq: Four times a day (QID) | ORAL | 0 refills | Status: DC | PRN

## 2020-06-26 MED ORDER — ONDANSETRON HCL 4 MG/2ML IJ SOLN: 4.0000 mg | INTRAMUSCULAR | Status: DC | PRN

## 2020-06-26 MED ORDER — ACETAMINOPHEN 325 MG PO TABS
650.0000 mg | ORAL_TABLET | ORAL | Status: DC | PRN
  Administered 2020-06-26: 650 mg via ORAL
  Filled 2020-06-26: qty 2

## 2020-06-26 MED ORDER — WITCH HAZEL-GLYCERIN EX PADS: 1.0000 "application " | MEDICATED_PAD | CUTANEOUS | Status: DC | PRN

## 2020-06-26 MED ORDER — NORETHINDRONE 0.35 MG PO TABS: 1.0000 | ORAL_TABLET | Freq: Every day | ORAL | 11 refills | Status: DC

## 2020-06-26 MED ORDER — MEASLES, MUMPS & RUBELLA VAC IJ SOLR: 0.5000 mL | Freq: Once | INTRAMUSCULAR | Status: DC

## 2020-06-26 MED ORDER — DIPHENHYDRAMINE HCL 25 MG PO CAPS: 25.0000 mg | ORAL_CAPSULE | Freq: Four times a day (QID) | ORAL | Status: DC | PRN

## 2020-06-26 MED ORDER — ZOLPIDEM TARTRATE 5 MG PO TABS: 5.0000 mg | ORAL_TABLET | Freq: Every evening | ORAL | Status: DC | PRN

## 2020-06-26 MED ORDER — DIBUCAINE (PERIANAL) 1 % EX OINT
1.0000 | TOPICAL_OINTMENT | CUTANEOUS | Status: DC | PRN
Start: 2020-06-25 — End: 2020-06-27

## 2020-06-26 MED ORDER — COCONUT OIL OIL: 1.0000 "application " | TOPICAL_OIL | Status: DC | PRN

## 2020-06-26 MED ORDER — ONDANSETRON HCL 4 MG PO TABS: 4.0000 mg | ORAL_TABLET | ORAL | Status: DC | PRN

## 2020-06-26 MED ORDER — SENNOSIDES-DOCUSATE SODIUM 8.6-50 MG PO TABS
2.0000 | ORAL_TABLET | ORAL | Status: DC
  Administered 2020-06-26: 2 via ORAL
  Filled 2020-06-26: qty 2

## 2020-06-26 MED ORDER — PRENATAL MULTIVITAMIN CH
1.0000 | ORAL_TABLET | Freq: Every day | ORAL | Status: DC
  Administered 2020-06-26: 1 via ORAL
  Filled 2020-06-26: qty 1

## 2020-06-26 NOTE — Anesthesia Postprocedure Evaluation (Signed)
Anesthesia Post Note  Patient: Suzanne Velez  Procedure(s) Performed: AN AD HOC LABOR EPIDURAL     Patient location during evaluation: Mother Baby Anesthesia Type: Epidural Level of consciousness: awake and oriented Pain management: pain level controlled Vital Signs Assessment: post-procedure vital signs reviewed and stable Respiratory status: spontaneous breathing, nonlabored ventilation and respiratory function stable Cardiovascular status: stable Postop Assessment: no headache, adequate PO intake, able to ambulate, no backache, patient able to bend at knees and no apparent nausea or vomiting Anesthetic complications: no   No complications documented.  Last Vitals:  Vitals:   06/25/20 2324 06/26/20 0409  BP: (!) 102/58 (!) 94/55  Pulse: 60 66  Resp:  17  Temp: 36.6 C 36.9 C  SpO2: 100% 99%    Last Pain:  Vitals:   06/26/20 0930  TempSrc:   PainSc: 0-No pain   Pain Goal:                Epidural/Spinal Function Cutaneous sensation: Normal sensation (06/26/20 0930), Patient able to flex knees: Yes (06/26/20 0930), Patient able to lift hips off bed: Yes (06/26/20 0930), Back pain beyond tenderness at insertion site: No (06/26/20 0930), Progressively worsening motor and/or sensory loss: No (06/26/20 0930), Bowel and/or bladder incontinence post epidural: No (06/26/20 0930)  Jeremaine Maraj

## 2020-06-26 NOTE — Progress Notes (Signed)
Post Partum Day 1 Subjective: no complaints, up ad lib, voiding and tolerating PO  Objective: Blood pressure (!) 94/55, pulse 66, temperature 98.5 F (36.9 C), temperature source Oral, resp. rate 17, last menstrual period 09/10/2019, SpO2 99 %, unknown if currently breastfeeding.  Physical Exam:  General: alert, cooperative and no distress Lochia: appropriate Uterine Fundus: firm Incision: n/a DVT Evaluation: No evidence of DVT seen on physical exam.  Recent Labs    06/25/20 0051  HGB 12.9  HCT 36.9    Assessment/Plan: Plan for discharge tomorrow, Breastfeeding and Contraception POPs   LOS: 1 day   Wynelle Bourgeois 06/26/2020, 6:30 AM

## 2020-08-14 ENCOUNTER — Emergency Department (HOSPITAL_COMMUNITY)
Admission: EM | Admit: 2020-08-14 | Discharge: 2020-08-14 | Disposition: A | Discharge status: Home / Self Care | Payer: Medicaid Other | Attending: Emergency Medicine | Admitting: Emergency Medicine

## 2020-08-14 ENCOUNTER — Encounter (HOSPITAL_COMMUNITY): Payer: Self-pay | Admitting: Emergency Medicine

## 2020-08-14 DIAGNOSIS — R059 Cough, unspecified: Secondary | ICD-10-CM | POA: Diagnosis present

## 2020-08-14 DIAGNOSIS — J069 Acute upper respiratory infection, unspecified: Secondary | ICD-10-CM | POA: Diagnosis not present

## 2020-08-14 MED ORDER — DEXAMETHASONE 4 MG PO TABS: 10.0000 mg | ORAL_TABLET | Freq: Once | ORAL | Status: DC

## 2020-08-14 MED ORDER — AMOXICILLIN 500 MG PO CAPS: 500.0000 mg | ORAL_CAPSULE | Freq: Two times a day (BID) | ORAL | 0 refills | Status: AC

## 2020-08-14 NOTE — ED Triage Notes (Signed)
Patient complains of headache and generalized body aches that started three days ago. Patient has tried ibuprofen and acetaminophen with minimal relief. Patient alert, oriented, and in no apparent distress at this time.

## 2020-08-14 NOTE — ED Provider Notes (Signed)
[facility] EMERGENCY DEPARTMENT Provider Note   CSN: [id number] Arrival date & time: 08/14/20  1122     History Chief Complaint  Patient presents with   Fatigue    Suzanne Velez is a 25 y.o. female.  The history is provided by the patient.  URI Presenting symptoms: cough and sore throat   Presenting symptoms: no ear pain and no fever   Severity:  Mild Onset quality:  Gradual Timing:  Constant Progression:  Unchanged Chronicity:  New Relieved by:  Nothing Worsened by:  Nothing Associated symptoms: myalgias and sinus pain   Associated symptoms: no arthralgias and no headaches   Risk factors: no sick contacts       Past Medical History:  Diagnosis Date   Anemia    Medical history non-contributory     Patient Active Problem List   Diagnosis Date Noted   Post term pregnancy over 40 weeks 06/25/2020   Indication for care in labor and delivery, antepartum 10/01/2018   Rupture of membranes with delay of delivery 10/01/2018    Past Surgical History:  Procedure Laterality Date   NO PAST SURGERIES       OB History     Gravida  2   Para  2   Term  2   Preterm      AB      Living  2      SAB      IAB      Ectopic      Multiple  0   Live Births  2           Family History  Problem Relation Age of Onset   Diabetes Paternal Uncle     Social History   Tobacco Use   Smoking status: Never   Smokeless tobacco: Never  Vaping Use   Vaping Use: Never used  Substance Use Topics   Alcohol use: Never   Drug use: Never    Home Medications Prior to Admission medications   Medication Sig Start Date End Date Taking? Authorizing Provider  amoxicillin (AMOXIL) 500 MG capsule Take 1 capsule (500 mg total) by mouth 2 (two) times daily for 10 days. 08/14/20 08/24/20 Yes Suzanne Zaffino, DO  acetaminophen (TYLENOL) 325 MG tablet Take 2 tablets (650 mg total) by mouth every 4 (four) hours as needed (for pain scale < 4).  06/26/20   Rosalio Loud, MD  ibuprofen (ADVIL) 600 MG tablet Take 1 tablet (600 mg total) by mouth every 6 (six) hours as needed. 06/26/20   Rosalio Loud, MD  norethindrone (ORTHO MICRONOR) 0.35 MG tablet Take 1 tablet (0.35 mg total) by mouth daily. 06/26/20 06/26/21  Rosalio Loud, MD  Prenatal Vit-Fe Fumarate-FA (MULTIVITAMIN-PRENATAL) 27-0.8 MG TABS tablet Take 1 tablet by mouth daily at 12 noon.    [provider]    Allergies    Pork-derived products  Review of Systems   Review of Systems  Constitutional:  Negative for chills and fever.  HENT:  Positive for sinus pain and sore throat. Negative for ear pain.   Eyes:  Negative for pain and visual disturbance.  Respiratory:  Positive for cough. Negative for shortness of breath.   Cardiovascular:  Negative for chest pain and palpitations.  Gastrointestinal:  Negative for abdominal pain and vomiting.  Genitourinary:  Negative for dysuria and hematuria.  Musculoskeletal:  Positive for myalgias. Negative for arthralgias and back pain.  Skin:  Negative for color change and  rash.  Neurological:  Negative for seizures, syncope and headaches.  All other systems reviewed and are negative.  Physical Exam Updated Vital Signs  ED Triage Vitals  Enc Vitals Group     BP 08/14/20 1225 102/71     Pulse Rate 08/14/20 1225 (!) 101     Resp 08/14/20 1225 16     Temp 08/14/20 1225 97.8 F (36.6 C)     Temp Source 08/14/20 1225 Oral     SpO2 08/14/20 1225 99 %     Weight --      Height --      Head Circumference --      Peak Flow --      Pain Score 08/14/20 1222 5     Pain Loc --      Pain Edu? --      Excl. in GC? --      Physical Exam Vitals and nursing note reviewed.  Constitutional:      General: She is not in acute distress.    Appearance: She is well-developed. She is not ill-appearing.  HENT:     Head: Normocephalic and atraumatic.     Mouth/Throat:     Mouth: Mucous membranes are moist.      Pharynx: No oropharyngeal exudate or posterior oropharyngeal erythema.  Eyes:     Extraocular Movements: Extraocular movements intact.     Conjunctiva/sclera: Conjunctivae normal.     Pupils: Pupils are equal, round, and reactive to light.  Cardiovascular:     Rate and Rhythm: Normal rate and regular rhythm.     Pulses: Normal pulses.     Heart sounds: Normal heart sounds. No murmur heard. Pulmonary:     Effort: Pulmonary effort is normal. No respiratory distress.     Breath sounds: Normal breath sounds.  Abdominal:     Palpations: Abdomen is soft.     Tenderness: There is no abdominal tenderness.  Musculoskeletal:     Cervical back: Normal range of motion and neck supple.  Skin:    General: Skin is warm and dry.     Capillary Refill: Capillary refill takes less than 2 seconds.  Neurological:     General: No focal deficit present.     Mental Status: She is alert.    ED Results / Procedures / Treatments   Labs (all labs ordered are listed, but only abnormal results are displayed) Labs Reviewed  RESP PANEL BY RT-PCR (FLU A&B, COVID) [id number]    EKG None  Radiology No results found.  Procedures Procedures   Medications Ordered in ED Medications  dexamethasone (DECADRON) tablet 10 mg (has no administration in time range)    ED Course  I have reviewed the triage vital signs and the nursing notes.  Pertinent labs & imaging results that were available during my care of the patient were reviewed by me and considered in my medical decision making (see chart for details).    MDM Rules/Calculators/A&P                           Suzanne Velez is here with cough, sore throat, body aches.  Normal vitals.  No fever.  Symptoms for several days.  Has had fever at home and has taken Tylenol and ibuprofen.  No sick contacts.  Throat exam is overall unremarkable.  Clear breath sounds.  No signs of respiratory distress.  Overall suspect viral process.  Likely COVID.  Gave  her  dose of Decadron for sore throat.  Will prescribe amoxicillin if COVID test and flu test is negative.  Understands return precautions and discharged in ED in good condition.  This chart was dictated using voice recognition software.  Despite best efforts to proofread,  errors can occur which can change the documentation meaning.   Final Clinical Impression(s) / ED Diagnoses Final diagnoses:  Upper respiratory tract infection, unspecified type    Rx / DC Orders ED Discharge Orders          Ordered    amoxicillin (AMOXIL) 500 MG capsule  2 times daily        08/14/20 1746             Virgina Norfolk, DO 08/14/20 1748

## 2020-08-14 NOTE — Discharge Instructions (Addendum)
Follow-up with your MyChart for your COVID and flu test.  If covid and flu test is negative get amoxicillin prescription filled as discussed.

## 2020-08-19 IMAGING — US US BREAST*L* LIMITED INC AXILLA
1 series · 4 of 4 positions shown · non-contrast
Comparison: Previous exam(s).

CLINICAL DATA: The patient was evaluated for a palpable lump in the
left breast July 15, 2018. At that time, the patient was 29 weeks
pregnant. She presents today for biopsy. The mass feels smaller to
her since delivering her child.

EXAM:
ULTRASOUND OF THE LEFT BREAST

[Series 1: us breast*left* limited inc axilla · 0.07mm/px · 4 of 4 slices shown]
[im 1/4]
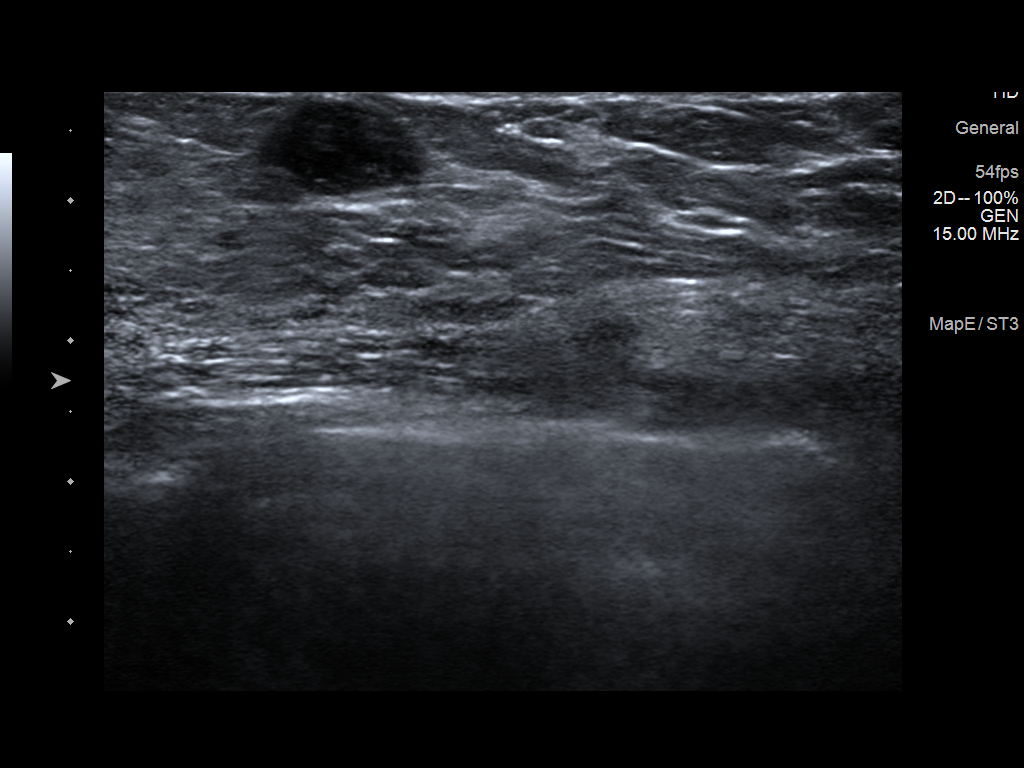
[im 2/4]
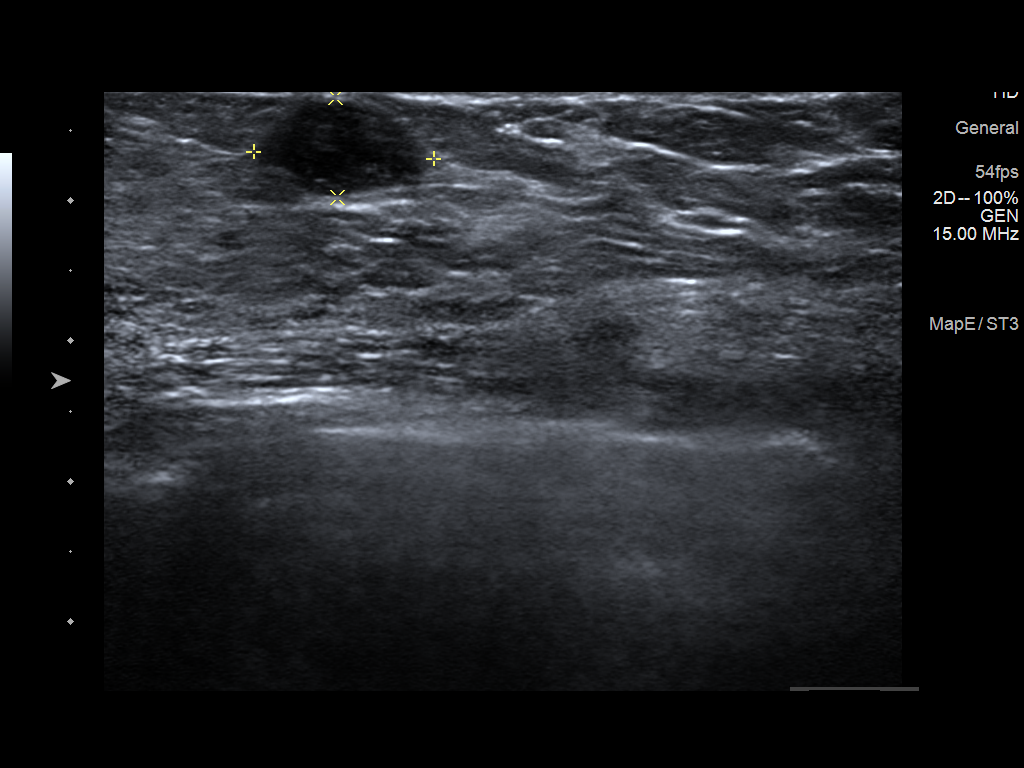
[im 3/4]
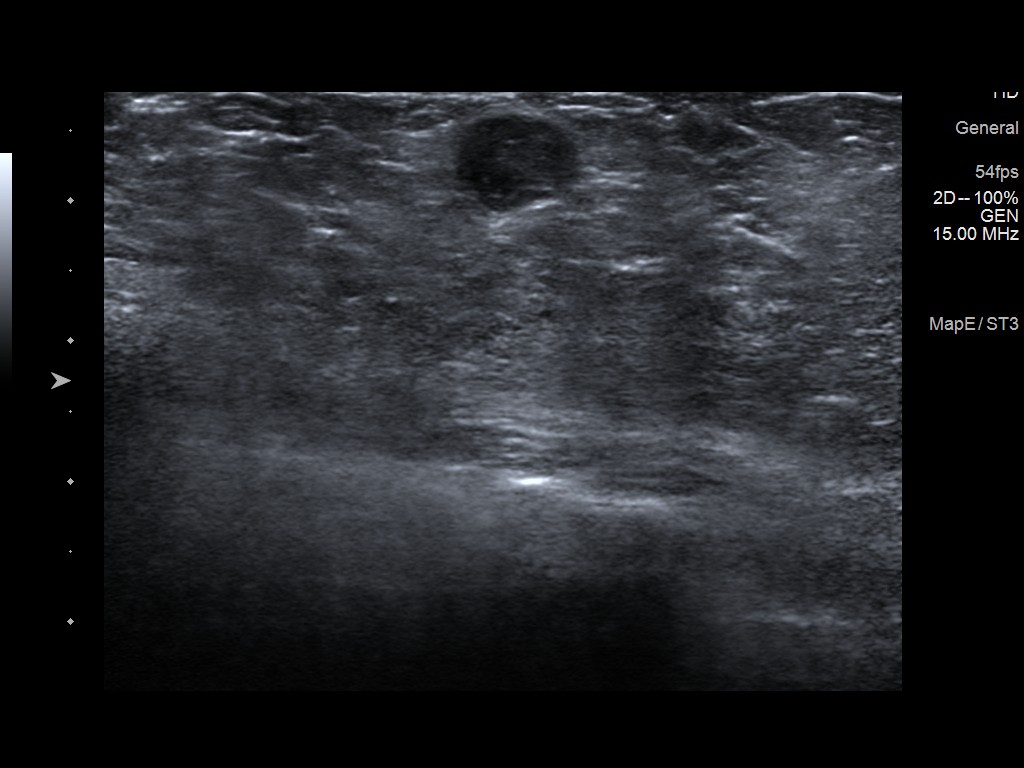
[im 4/4]
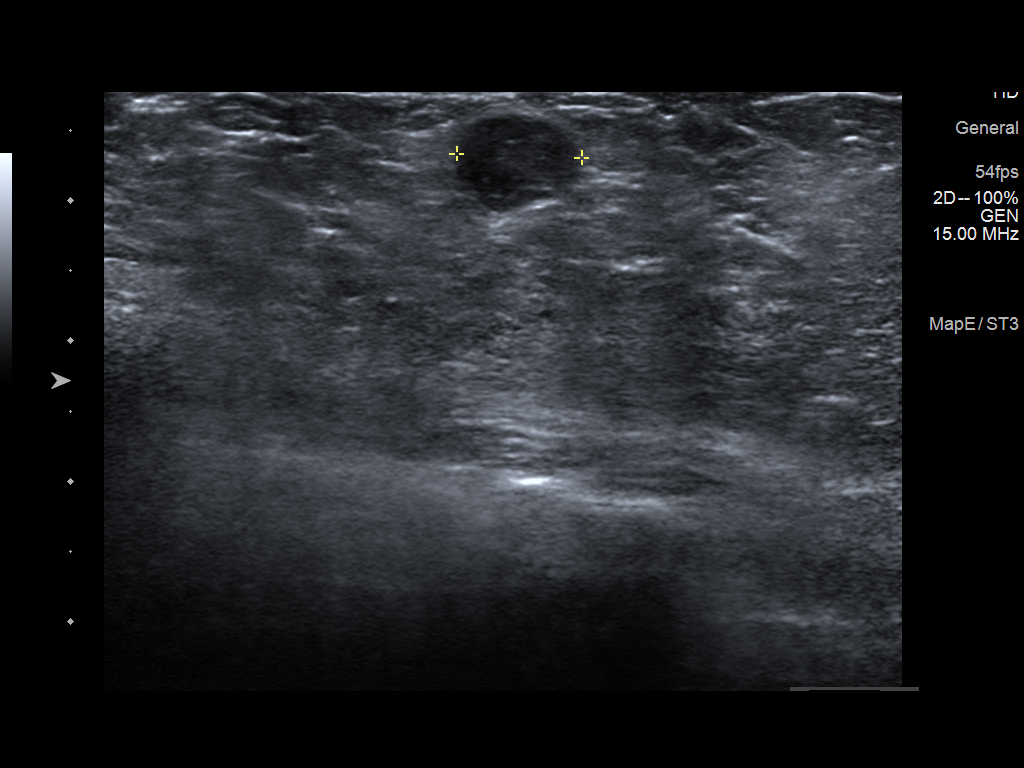

[4 of 4 positions shown; findings below may reference images not displayed]

FINDINGS: On physical exam, a lump remains in the left breast at 4 o'clock, 10
cm from the nipple.

Targeted ultrasound is performed, showing a persistent mass in the
left breast at 4 o'clock, 10 cm from the nipple measuring 13 x 7 x 9
mm today versus 16 x 11 by 16 mm previously.
IMPRESSION: The volume of the mass in the left breast is decreased by nearly
70%.

RECOMMENDATION:
Given the significant decrease in size of the left breast mass, the
options of biopsy versus 6 month follow-up were given to the
patient. Given the fact the patient is breast feeding and there is a
risk for a milk fistula, it was decided to follow the mass in 6
months. The patient will return immediately if she notices any
enlargement by palpation to the mass.

I have discussed the findings and recommendations with the patient.
If applicable, a reminder letter will be sent to the patient
regarding the next appointment.

BI-RADS CATEGORY  3: Probably benign.

## 2020-08-20 ENCOUNTER — Encounter (HOSPITAL_BASED_OUTPATIENT_CLINIC_OR_DEPARTMENT_OTHER): Payer: Self-pay | Admitting: *Deleted

## 2020-08-20 ENCOUNTER — Emergency Department (HOSPITAL_BASED_OUTPATIENT_CLINIC_OR_DEPARTMENT_OTHER)
Admission: EM | Admit: 2020-08-20 | Discharge: 2020-08-21 | Disposition: A | Discharge status: Home / Self Care | Payer: Medicaid Other | Attending: Emergency Medicine | Admitting: Emergency Medicine

## 2020-08-20 ENCOUNTER — Other Ambulatory Visit: Payer: Self-pay

## 2020-08-20 DIAGNOSIS — M549 Dorsalgia, unspecified: Secondary | ICD-10-CM | POA: Diagnosis not present

## 2020-08-20 DIAGNOSIS — N9489 Other specified conditions associated with female genital organs and menstrual cycle: Secondary | ICD-10-CM | POA: Diagnosis not present

## 2020-08-20 DIAGNOSIS — N939 Abnormal uterine and vaginal bleeding, unspecified: Secondary | ICD-10-CM | POA: Insufficient documentation

## 2020-08-20 DIAGNOSIS — R1030 Lower abdominal pain, unspecified: Secondary | ICD-10-CM | POA: Insufficient documentation

## 2020-08-20 NOTE — ED Triage Notes (Signed)
She had a baby 2 months ago. Vaginal bleeding on and off since. She was told yesterday to come to the ER. Back and abdominal pain. She has been taking Tylenol. Fever off and on since delivery.

## 2020-08-21 ENCOUNTER — Emergency Department (HOSPITAL_BASED_OUTPATIENT_CLINIC_OR_DEPARTMENT_OTHER): Payer: Medicaid Other

## 2020-08-21 LAB — CBC WITH DIFFERENTIAL/PLATELET
Abs Immature Granulocytes: 0.09 10*3/uL — ABNORMAL HIGH (ref 0.00–0.07)
Basophils Absolute: 0 10*3/uL (ref 0.0–0.1)
Basophils Relative: 0 %
Eosinophils Absolute: 0.1 10*3/uL (ref 0.0–0.5)
Eosinophils Relative: 1 %
HCT: 37.7 % (ref 36.0–46.0)
Hemoglobin: 12.7 g/dL (ref 12.0–15.0)
Immature Granulocytes: 1 %
Lymphocytes Relative: 43 %
Lymphs Abs: 3.7 10*3/uL (ref 0.7–4.0)
MCH: 29.1 pg (ref 26.0–34.0)
MCHC: 33.7 g/dL (ref 30.0–36.0)
MCV: 86.5 fL (ref 80.0–100.0)
Monocytes Absolute: 0.4 10*3/uL (ref 0.1–1.0)
Monocytes Relative: 4 %
Neutro Abs: 4.5 10*3/uL (ref 1.7–7.7)
Neutrophils Relative %: 51 %
Platelets: 263 10*3/uL (ref 150–400)
RBC: 4.36 MIL/uL (ref 3.87–5.11)
RDW: 13.1 % (ref 11.5–15.5)
WBC: 8.7 10*3/uL (ref 4.0–10.5)
nRBC: 0 % (ref 0.0–0.2)

## 2020-08-21 LAB — HCG, SERUM, QUALITATIVE: Preg, Serum: NEGATIVE

## 2020-08-21 LAB — COMPREHENSIVE METABOLIC PANEL
ALT: 22 U/L (ref 0–44)
AST: 17 U/L (ref 15–41)
Albumin: 4.3 g/dL (ref 3.5–5.0)
Alkaline Phosphatase: 58 U/L (ref 38–126)
Anion gap: 7 (ref 5–15)
BUN: 10 mg/dL (ref 6–20)
CO2: 27 mmol/L (ref 22–32)
Calcium: 8.9 mg/dL (ref 8.9–10.3)
Chloride: 105 mmol/L (ref 98–111)
Creatinine, Ser: 0.65 mg/dL (ref 0.44–1.00)
GFR, Estimated: >60 mL/min (ref >60)
Glucose, Bld: 94 mg/dL (ref 70–99)
Potassium: 3.5 mmol/L (ref 3.5–5.1)
Sodium: 139 mmol/L (ref 135–145)
Total Bilirubin: 0.3 mg/dL (ref 0.3–1.2)
Total Protein: 7.2 g/dL (ref 6.5–8.1)

## 2020-08-21 NOTE — ED Notes (Addendum)
Attempted PIV, unsuccessful but was able to draw labs; will re-assess need for PIV later; pt tolerated well

## 2020-08-21 NOTE — ED Provider Notes (Signed)
[facility] EMERGENCY DEPARTMENT Provider Note   CSN: [id number] Arrival date & time: 08/20/20  2133     History Chief Complaint  Patient presents with   Vaginal Bleeding    Suzanne Velez is a 25 y.o. female.  Patient is a 24 year old female with history of anemia and recent childbirth.  She had recent vaginal delivery 2 months ago at [facility].  Everything seemed to go well and she was discharged in a timely fashion.  She reports vaginal bleeding off and on since.  She also reports some lower abdominal and back pain, but denies any fevers or chills.  She denies any urinary complaints.  The history is provided by the patient.  Vaginal Bleeding Quality:  Dark red Severity:  Moderate Timing:  Intermittent Progression:  Unchanged     Past Medical History:  Diagnosis Date   Anemia    Medical history non-contributory     Patient Active Problem List   Diagnosis Date Noted   Post term pregnancy over 40 weeks 06/25/2020   Indication for care in labor and delivery, antepartum 10/01/2018   Rupture of membranes with delay of delivery 10/01/2018    Past Surgical History:  Procedure Laterality Date   NO PAST SURGERIES       OB History     Gravida  2   Para  2   Term  2   Preterm      AB      Living  2      SAB      IAB      Ectopic      Multiple  0   Live Births  2           Family History  Problem Relation Age of Onset   Diabetes Paternal Uncle     Social History   Tobacco Use   Smoking status: Never   Smokeless tobacco: Never  Vaping Use   Vaping Use: Never used  Substance Use Topics   Alcohol use: Never   Drug use: Never    Home Medications Prior to Admission medications   Medication Sig Start Date End Date Taking? Authorizing Provider  acetaminophen (TYLENOL) 325 MG tablet Take 2 tablets (650 mg total) by mouth every 4 (four) hours as needed (for pain scale < 4). 06/26/20  Yes Weisgerber, Kimberlee Nearing, MD   amoxicillin (AMOXIL) 500 MG capsule Take 1 capsule (500 mg total) by mouth 2 (two) times daily for 10 days. 08/14/20 08/24/20  Curatolo, Adam, DO  ibuprofen (ADVIL) 600 MG tablet Take 1 tablet (600 mg total) by mouth every 6 (six) hours as needed. 06/26/20   Rosalio Loud, MD  norethindrone (ORTHO MICRONOR) 0.35 MG tablet Take 1 tablet (0.35 mg total) by mouth daily. 06/26/20 06/26/21  Rosalio Loud, MD  Prenatal Vit-Fe Fumarate-FA (MULTIVITAMIN-PRENATAL) 27-0.8 MG TABS tablet Take 1 tablet by mouth daily at 12 noon.    [provider]    Allergies    Pork-derived products  Review of Systems   Review of Systems  Genitourinary:  Positive for vaginal bleeding.  All other systems reviewed and are negative.  Physical Exam Updated Vital Signs BP 115/77   Pulse 61   Temp 98.3 F (36.8 C) (Oral)   Resp 18   Ht 5\' 5"  (1.651 m)   Wt 64.9 kg   SpO2 98%   Breastfeeding No   BMI 23.80 kg/m   Physical Exam Vitals and nursing note  reviewed.  Constitutional:      General: She is not in acute distress.    Appearance: She is well-developed. She is not diaphoretic.  HENT:     Head: Normocephalic and atraumatic.  Cardiovascular:     Rate and Rhythm: Normal rate and regular rhythm.     Heart sounds: No murmur heard.   No friction rub. No gallop.  Pulmonary:     Effort: Pulmonary effort is normal. No respiratory distress.     Breath sounds: Normal breath sounds. No wheezing.  Abdominal:     General: Bowel sounds are normal. There is no distension.     Palpations: Abdomen is soft.     Tenderness: There is no abdominal tenderness.  Musculoskeletal:        General: Normal range of motion.     Cervical back: Normal range of motion and neck supple.  Skin:    General: Skin is warm and dry.     Coloration: Skin is pale.  Neurological:     General: No focal deficit present.     Mental Status: She is alert and oriented to person, place, and time.    ED Results /  Procedures / Treatments   Labs (all labs ordered are listed, but only abnormal results are displayed) Labs Reviewed  CBC WITH DIFFERENTIAL/PLATELET  COMPREHENSIVE METABOLIC PANEL  HCG, SERUM, QUALITATIVE    EKG None  Radiology No results found.  Procedures Procedures   Medications Ordered in ED Medications - No data to display  ED Course  I have reviewed the triage vital signs and the nursing notes.  Pertinent labs & imaging results that were available during my care of the patient were reviewed by me and considered in my medical decision making (see chart for details).    MDM Rules/Calculators/A&P  Patient is a 25 year old female presenting with occasional vaginal bleeding since childbirth 2 months ago.  Patient arrives here with stable vital signs and is had no additional bleeding while in the ER.  Her hemoglobin is 12.7 and was 12.9 upon discharge after childbirth.  Ultrasound shows no evidence for retained products.  She has no white count, no fever, and the ultrasound is otherwise unremarkable with the exception of a small ovarian cyst.  Patient seems appropriate for discharge at this time.  I have advised them to call their OB/GYN in the morning to arrange a follow-up appointment.  Final Clinical Impression(s) / ED Diagnoses Final diagnoses:  None    Rx / DC Orders ED Discharge Orders     None        Geoffery Lyons, MD 08/21/20 0128

## 2020-08-21 NOTE — Discharge Instructions (Addendum)
Follow-up with your OB/GYN in the next few days, and return to the ER if you develop severe bleeding, high fevers, severe abdominal pain, or other new and concerning symptoms.

## 2021-02-17 IMAGING — US US BREAST*L* LIMITED INC AXILLA
1 series · 5 of 5 positions shown · non-contrast
Comparison: Previous exam(s).

CLINICAL DATA: 23-year-old female presenting for short-term
follow-up of a probably benign left breast mass. Patient with
pregnant at the time the mass was discovered. She is not breast
feeding.

EXAM:
ULTRASOUND OF THE LEFT BREAST

[Series 1: us breast*left* limited inc axilla · 0.05mm/px · 5 of 5 slices shown]
[im 1/5]
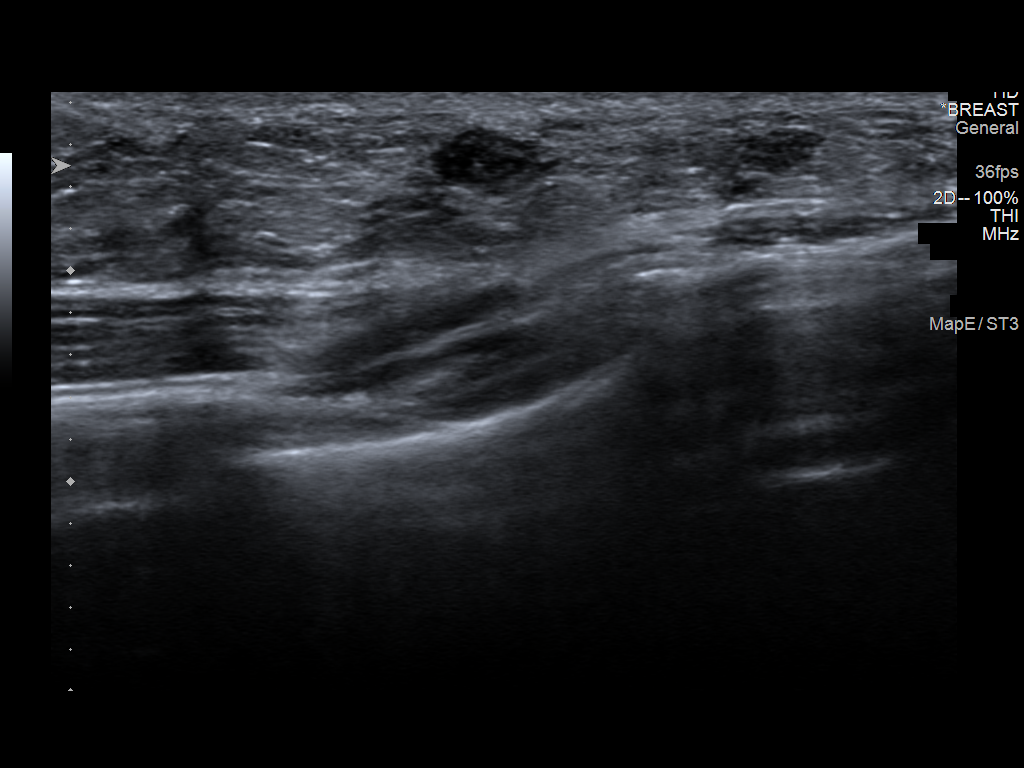
[im 2/5]
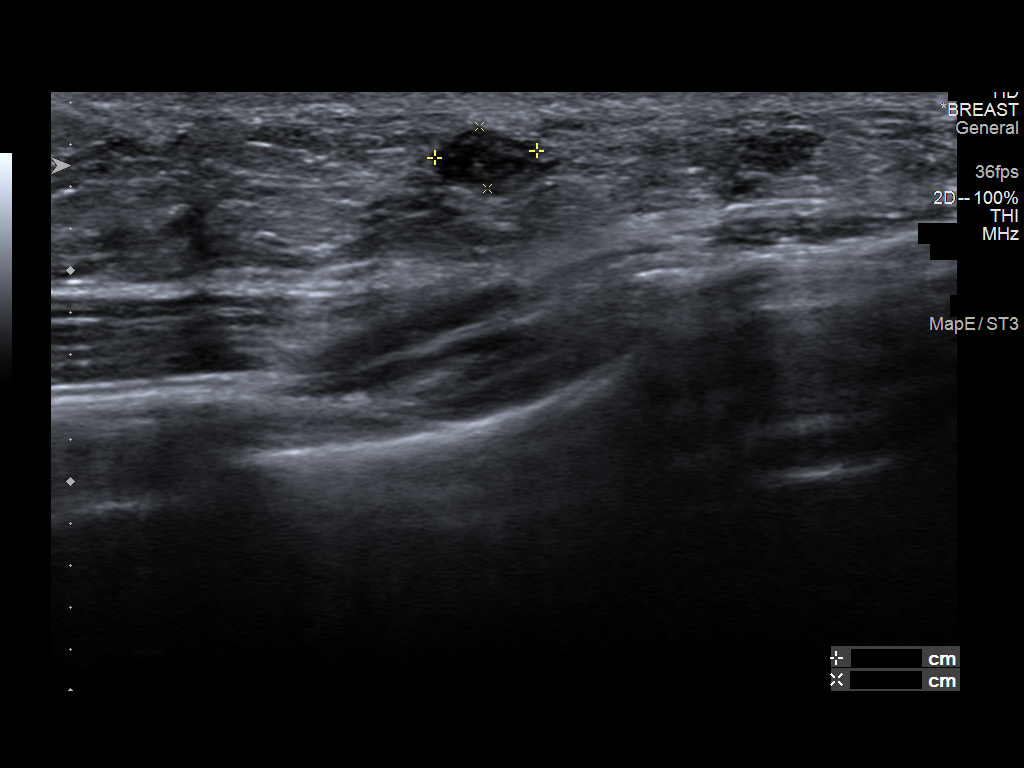
[im 3/5]
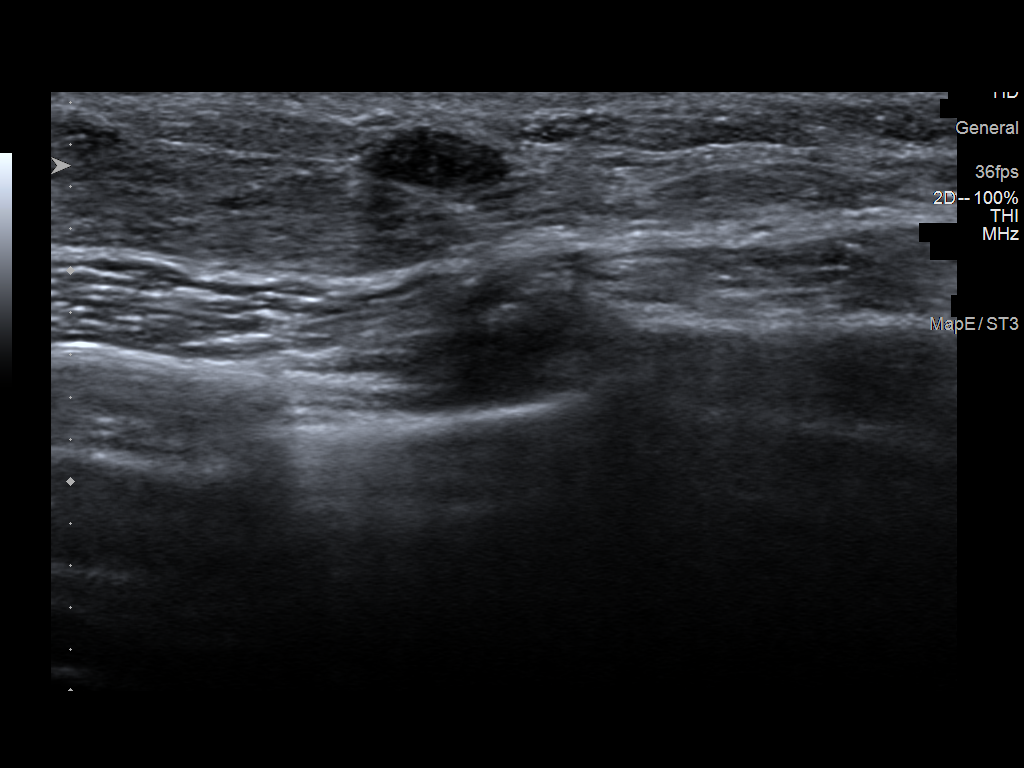
[im 4/5]
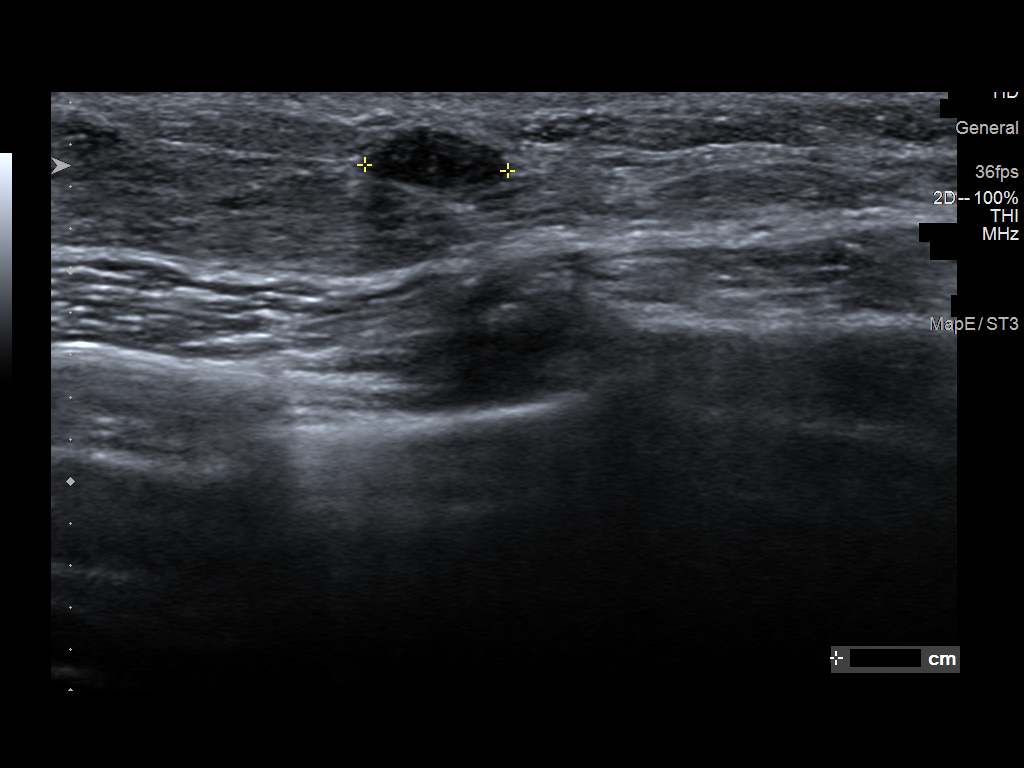
[im 5/5]
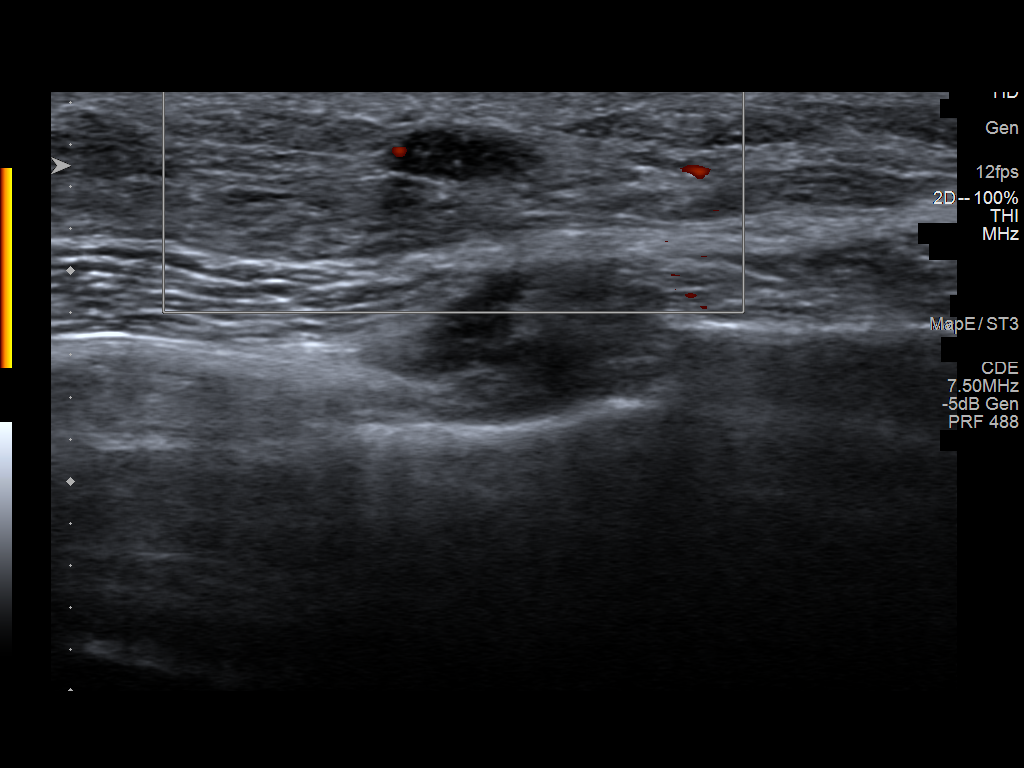

[5 of 5 positions shown; findings below may reference images not displayed]

FINDINGS: Targeted ultrasound is performed in the left breast at 4 o'clock 10
cm from the nipple demonstrating an oval circumscribed hypoechoic
mass measuring 0.7 x 0.3 x 0.5 cm, previously measuring 1.3 x 0.7 x
0.8 cm. This mass initially measured up to 1.6 cm.
IMPRESSION: Continued decrease in size of a left breast mass at 4 o'clock now
measuring 0.7 cm, originally measuring up to 1.6 cm. Given the
significant decrease in size, this mass is considered benign and
most likely represented a lactating adenoma.

RECOMMENDATION:
1.  Clinical follow-up as needed for the left breast.

2.  Begin routine annual screening mammography at age 42.

I have discussed the findings and recommendations with the patient.
If applicable, a reminder letter will be sent to the patient
regarding the next appointment.

BI-RADS CATEGORY  2: Benign.

## 2021-09-30 ENCOUNTER — Ambulatory Visit (INDEPENDENT_AMBULATORY_CARE_PROVIDER_SITE_OTHER): Payer: Commercial Managed Care - HMO | Admitting: Family Medicine

## 2021-09-30 ENCOUNTER — Encounter: Payer: Self-pay | Admitting: Family Medicine

## 2021-09-30 VITALS — BP 100/68 | HR 76 | Temp 97.9°F | Ht 67.25 in | Wt 133.5 lb

## 2021-09-30 DIAGNOSIS — N6311 Unspecified lump in the right breast, upper outer quadrant: Secondary | ICD-10-CM

## 2021-09-30 DIAGNOSIS — N644 Mastodynia: Secondary | ICD-10-CM

## 2021-09-30 NOTE — Progress Notes (Unsigned)
New Patient Office Visit  Subjective    Patient ID: Suzanne Velez, female    DOB: 11/10/1995  Age: 26 y.o. MRN: [id number]  CC:  Chief Complaint  Patient presents with   Establish Care    HPI Suzanne Velez presents to establish care Paient is here reporting new symptoms of a breast cyst, the left start first 6-9 months ago, right started a couple of week ago. Not red, she occasionally feels some pain on the left under her breast, occasionally the pain causes her to wake up at night. No fever or chills. She is not currently breastfeeding. Not taking ay medications regularly except for her oral contraceptives. Was seeing Dr. Hale Bogus at [facility].   Outpatient Encounter Medications as of 09/30/2021  Medication Sig   acetaminophen (TYLENOL) 325 MG tablet Take 2 tablets (650 mg total) by mouth every 4 (four) hours as needed (for pain scale < 4).   ibuprofen (ADVIL) 600 MG tablet Take 1 tablet (600 mg total) by mouth every 6 (six) hours as needed.   [DISCONTINUED] Prenatal Vit-Fe Fumarate-FA (MULTIVITAMIN-PRENATAL) 27-0.8 MG TABS tablet Take 1 tablet by mouth daily at 12 noon.   norethindrone (ORTHO MICRONOR) 0.35 MG tablet Take 1 tablet (0.35 mg total) by mouth daily.   No facility-administered encounter medications on file as of 09/30/2021.    Past Medical History:  Diagnosis Date   Anemia    Medical history non-contributory     Past Surgical History:  Procedure Laterality Date   NO PAST SURGERIES      Family History  Problem Relation Age of Onset   Diabetes Paternal Uncle     Social History   Socioeconomic History   Marital status: Married    Spouse name: Not on file   Number of children: Not on file   Years of education: Not on file   Highest education level: Not on file  Occupational History   Not on file  Tobacco Use   Smoking status: Never   Smokeless tobacco: Never  Vaping Use   Vaping Use: Never used  Substance and Sexual  Activity   Alcohol use: Never   Drug use: Never   Sexual activity: Yes    Comment: last sex 3 weeks ago  Other Topics Concern   Not on file  Social History Narrative   Not on file   Social Determinants of Health   Financial Resource Strain: Not on file  Food Insecurity: Not on file  Transportation Needs: Not on file  Physical Activity: Not on file  Stress: Not on file  Social Connections: Not on file  Intimate Partner Violence: Not on file    Review of Systems  Constitutional:  Negative for chills, fever, malaise/fatigue and weight loss.  Respiratory:  Negative for cough and shortness of breath.   Cardiovascular:  Negative for chest pain.  All other systems reviewed and are negative.       Objective    BP 100/68 (BP Location: Right Arm, Patient Position: Sitting, Cuff Size: Normal)   Pulse 76   Temp 97.9 F (36.6 C) (Oral)   Ht 5' 7.25" (1.708 m)   Wt 133 lb 8 oz (60.6 kg)   LMP 09/29/2021 (Exact Date)   SpO2 100%   BMI 20.75 kg/m   Physical Exam Vitals reviewed. Exam conducted with a chaperone present.  Constitutional:      Appearance: Normal appearance. She is well-groomed and normal weight.  HENT:  Head: Normocephalic and atraumatic.  Pulmonary:     Effort: Pulmonary effort is normal.  Chest:     Chest wall: Mass (small rounded cyst in the upper outer quadrant of the right breast, freely moveable, no tenderness with palpation) and tenderness (left lower outer portion) present.    Skin:    General: Skin is warm and dry.  Neurological:     Mental Status: She is alert and oriented to person, place, and time. Mental status is at baseline.     Gait: Gait is intact.  Psychiatric:        Mood and Affect: Mood and affect normal.        Speech: Speech normal.        Behavior: Behavior normal.        Judgment: Judgment normal.         Assessment & Plan:   Problem List Items Addressed This Visit   None Visit Diagnoses     Mass of upper outer  quadrant of right breast    -  Primary   Relevant Orders   MM Digital Screening   Breast pain, left       Relevant Orders   MM Digital Screening     The right breast cyst appears benign, she reports she does drink a lot of caffeine throughout the day. I will order a mammogram to start the evaluation. I advised she reduce the amount of caffeine in her diet. I will have her follow up in 3 months to complete the physical exam. Return in about 3 months (around 12/30/2021) for follow up breast pain.   Karie Georges, MD

## 2021-10-13 ENCOUNTER — Telehealth: Payer: Self-pay | Admitting: Family Medicine

## 2021-10-13 NOTE — Telephone Encounter (Signed)
Pt states they were told to call back if they haven't heard anything about scheduling the appointment in a week

## 2021-10-15 ENCOUNTER — Ambulatory Visit
Admission: EM | Admit: 2021-10-15 | Discharge: 2021-10-15 | Disposition: A | Discharge status: Home / Self Care | Payer: Commercial Managed Care - HMO | Attending: Urgent Care | Admitting: Urgent Care

## 2021-10-15 ENCOUNTER — Encounter: Payer: Self-pay | Admitting: Emergency Medicine

## 2021-10-15 DIAGNOSIS — K120 Recurrent oral aphthae: Secondary | ICD-10-CM

## 2021-10-15 MED ORDER — TRIAMCINOLONE ACETONIDE 0.1 % MT PSTE: 1.0000 | PASTE | Freq: Two times a day (BID) | OROMUCOSAL | 0 refills | Status: DC

## 2021-10-15 NOTE — ED Provider Notes (Signed)
[facility]  Note:  This document was prepared using Conservation officer, historic buildings and may include unintentional dictation errors.  MRN: [id number] DOB: 06/12/1995  Subjective:   Suzanne Velez Suzanne Velez is a 26 y.o. female presenting for concerns about white lines along the inside of her mouth. Has minimal tenderness over the right side. No fever, dental pain, painful chewing, swallowing. Tries to practice good dental hygiene. No smoking or chewing tobacco. No bleeding.   No current facility-administered medications for this encounter.  Current Outpatient Medications:    acetaminophen (TYLENOL) 325 MG tablet, Take 2 tablets (650 mg total) by mouth every 4 (four) hours as needed (for pain scale < 4)., Disp: , Rfl:    ibuprofen (ADVIL) 600 MG tablet, Take 1 tablet (600 mg total) by mouth every 6 (six) hours as needed., Disp: 30 tablet, Rfl: 0   norethindrone (ORTHO MICRONOR) 0.35 MG tablet, Take 1 tablet (0.35 mg total) by mouth daily., Disp: 28 tablet, Rfl: 11   Allergies  Allergen Reactions   Pork-Derived Products Other (See Comments)    Religion    Past Medical History:  Diagnosis Date   Anemia    Medical history non-contributory      Past Surgical History:  Procedure Laterality Date   NO PAST SURGERIES      Family History  Problem Relation Age of Onset   Diabetes Paternal Uncle     Social History   Tobacco Use   Smoking status: Never   Smokeless tobacco: Never  Vaping Use   Vaping Use: Never used  Substance Use Topics   Alcohol use: Never   Drug use: Never    ROS   Objective:   Vitals: BP 109/73 (BP Location: Left Arm)   Pulse 66   Temp 98.8 F (37.1 C) (Oral)   Resp 16   LMP 09/29/2021 (Exact Date)   SpO2 97%   Physical Exam Constitutional:      General: She is not in acute distress.    Appearance: Normal appearance. She is well-developed. She is not ill-appearing, toxic-appearing or diaphoretic.  HENT:     Head:  Normocephalic and atraumatic.     Nose: Nose normal.     Mouth/Throat:     Mouth: Mucous membranes are moist.     Comments: Very thin hypopigmented streak on either side following her dental lines. Aphthous ulcer over right upper side.  Eyes:     General: No scleral icterus.       Right eye: No discharge.        Left eye: No discharge.     Extraocular Movements: Extraocular movements intact.  Cardiovascular:     Rate and Rhythm: Normal rate.  Pulmonary:     Effort: Pulmonary effort is normal.  Skin:    General: Skin is warm and dry.  Neurological:     General: No focal deficit present.     Mental Status: She is alert and oriented to person, place, and time.  Psychiatric:        Mood and Affect: Mood normal.        Behavior: Behavior normal.     Assessment and Plan :   PDMP not reviewed this encounter.  1. Aphthous ulcer     Low suspicion for an infectious process. Patient had concerns about malignancy but I do not see signs to be concerned for this at this time. Advised good dental hygiene and follow up with a dental specialist. Use triamcinolone  dental paste over aphthous ulceration. Counseled patient on potential for adverse effects with medications prescribed/recommended today, ER and return-to-clinic precautions discussed, patient verbalized understanding.    Suzanne Velez, [place] 10/16/21 [phone]

## 2021-10-15 NOTE — ED Triage Notes (Signed)
Pt has two mouth lesions and jaw pain that started this morning.

## 2021-10-20 ENCOUNTER — Other Ambulatory Visit: Payer: Self-pay | Admitting: Family Medicine

## 2021-10-20 DIAGNOSIS — N644 Mastodynia: Secondary | ICD-10-CM

## 2021-10-20 DIAGNOSIS — N6311 Unspecified lump in the right breast, upper outer quadrant: Secondary | ICD-10-CM

## 2021-10-27 ENCOUNTER — Other Ambulatory Visit: Payer: Self-pay | Admitting: Family Medicine

## 2021-10-27 ENCOUNTER — Ambulatory Visit
Admission: RE | Admit: 2021-10-27 | Discharge: 2021-10-27 | Disposition: A | Discharge status: Home / Self Care | Payer: Commercial Managed Care - HMO | Source: Ambulatory Visit | Attending: Family Medicine | Admitting: Family Medicine

## 2021-10-27 DIAGNOSIS — N6320 Unspecified lump in the left breast, unspecified quadrant: Secondary | ICD-10-CM

## 2021-10-27 DIAGNOSIS — N644 Mastodynia: Secondary | ICD-10-CM

## 2021-10-27 DIAGNOSIS — N6310 Unspecified lump in the right breast, unspecified quadrant: Secondary | ICD-10-CM

## 2021-10-27 DIAGNOSIS — N6311 Unspecified lump in the right breast, upper outer quadrant: Secondary | ICD-10-CM

## 2021-10-27 NOTE — Progress Notes (Signed)
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 It recommends a follow up US in 6 months.

## 2021-10-28 ENCOUNTER — Telehealth: Payer: Self-pay | Admitting: Family Medicine

## 2021-10-28 NOTE — Telephone Encounter (Signed)
Spouse called back with Pt on the line, returning call to [place].    Spouse stated he has to translate for Pt, because she does not speak [place].  CMA was unavailable. Spouse requested a call back as soon as possible, as he must leave for work soon and will not be available to translate.

## 2021-10-28 NOTE — Telephone Encounter (Signed)
See results note. 

## 2021-10-28 NOTE — Telephone Encounter (Signed)
Spouse returned call with patient beside him, says he could not get info because she was not there.

## 2021-12-29 ENCOUNTER — Ambulatory Visit: Payer: Commercial Managed Care - HMO | Admitting: Family Medicine

## 2022-01-26 NOTE — L&D Delivery Note (Signed)
OB/GYN Faculty Practice Delivery Note  Suzanne Velez is a 27 y.o. [id number] s/p SVD at [redacted]w[redacted]d. She was admitted for IOL for ventriculmegaly of fetus and LGA.   ROM: 8h 84m with clear fluid GBS Status:  Negative/-- (10/15 0309) Maximum Maternal Temperature: 98.2F  Labor Progress: Initial SVE: 3/50/-3. Cytotec x3. AROM. She then progressed to complete @0017 .   Delivery Date/Time: 12/05/22 @0140   Delivery: Called to room and patient was complete with urge to push. Head delivered ROA. No nuchal cord present. Shoulder and body delivered in usual fashion. Infant with spontaneous cry, placed on mother's abdomen, dried and stimulated. Cord clamped x 2 after 1-minute delay, and cut by myself. Cord blood drawn. Placenta delivered spontaneously with gentle cord traction. Fundus firm with bimanual massage, methergine IM, two finger lower uterine segment sweep and Pitocin. Labia, perineum, vagina, and cervix inspected with 1st degree perineal laceration repaired with 3.0 Vicryl in standard fashion.    Baby Weight: 3685g  Placenta: 3 vessel, intact. Sent to pathology Complications: None Lacerations: 1st degree perineal; repaired EBL: 211 mL Analgesia: Epidural   Infant:  APGAR (1 MIN): 8  APGAR (5 MINS): 9   Wyn Forster, MD OB Family Medicine Fellow, [facility] for [facility], [facility] 12/05/2022, 2:12 AM

## 2022-04-07 ENCOUNTER — Encounter (HOSPITAL_BASED_OUTPATIENT_CLINIC_OR_DEPARTMENT_OTHER): Payer: Self-pay | Admitting: *Deleted

## 2022-04-26 IMAGING — US US ART/VEN ABD/PELV/SCROTUM DOPPLER LTD
1 series · 13 of 25 positions shown · non-contrast
Comparison: None.

CLINICAL DATA: Initial evaluation for acute vaginal bleeding,
recent postpartum.

EXAM:
TRANSABDOMINAL ULTRASOUND OF PELVIS
DOPPLER ULTRASOUND OF OVARIES
TECHNIQUE: Transabdominal ultrasound examination of the pelvis was performed
including evaluation of the uterus, ovaries, adnexal regions, and
pelvic cul-de-sac.
Color and duplex Doppler ultrasound was utilized to evaluate blood
flow to the ovaries.

[Series 1: us art/ven abd/pelv/scrotum doppler ltd · 69 acquisitions, 13 frames shown]
[im 1/69]
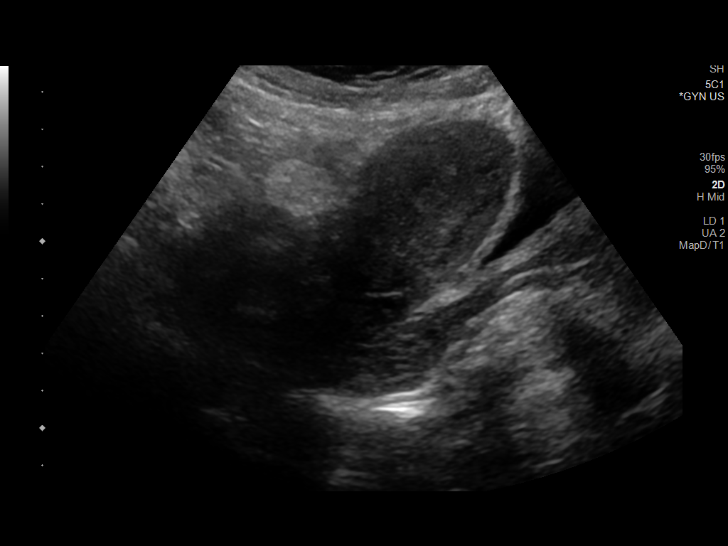
[im 6/69]
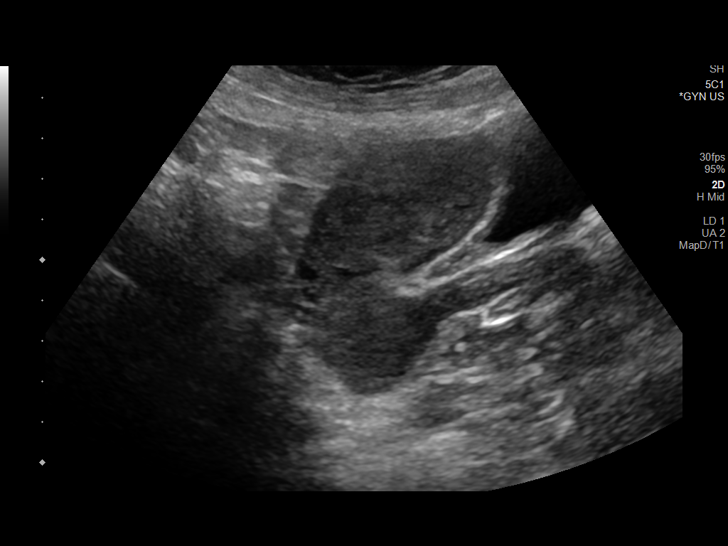
[im 12/69]
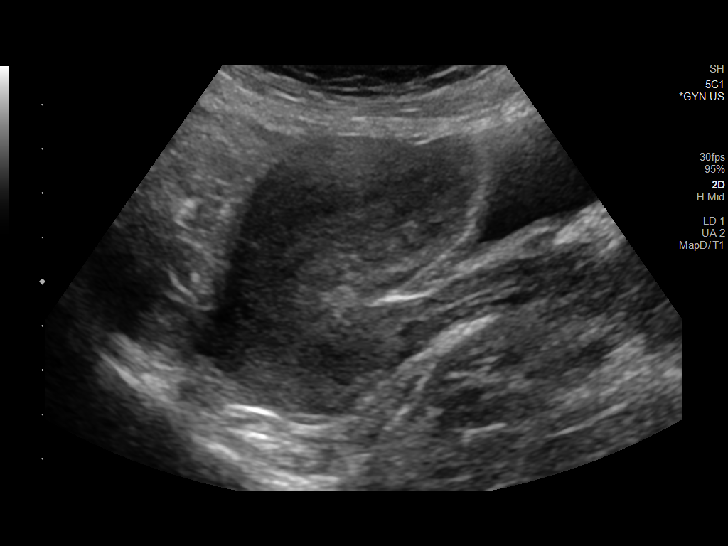
[im 18/69]
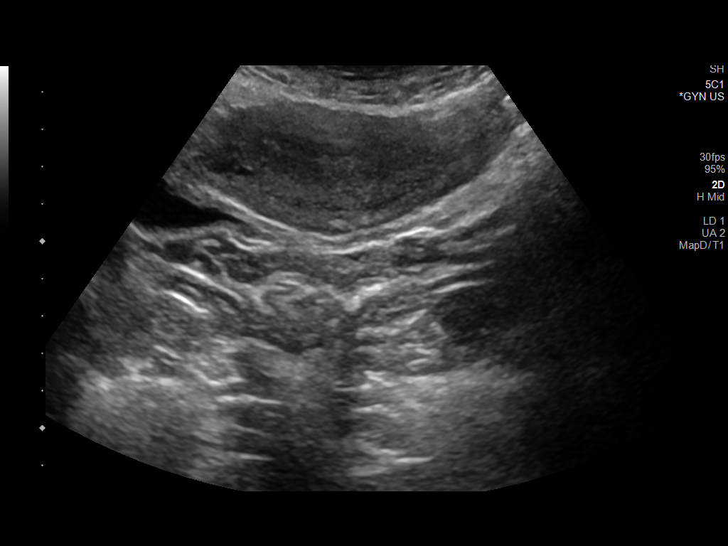
[im 23/69]
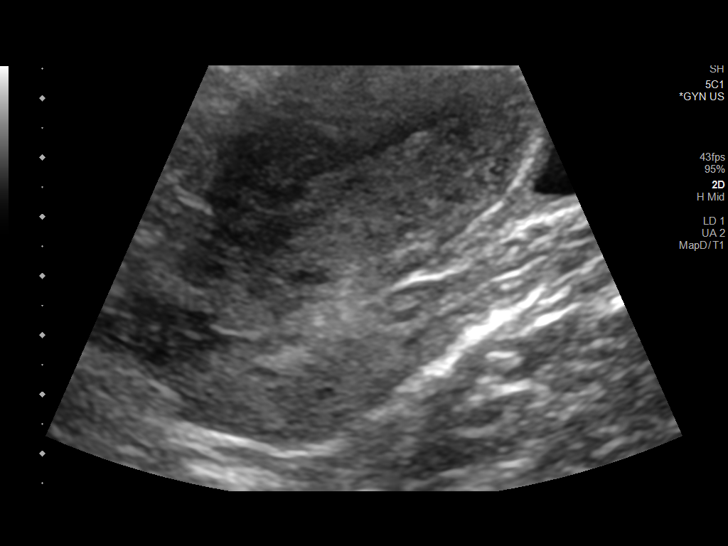
[im 29/69]
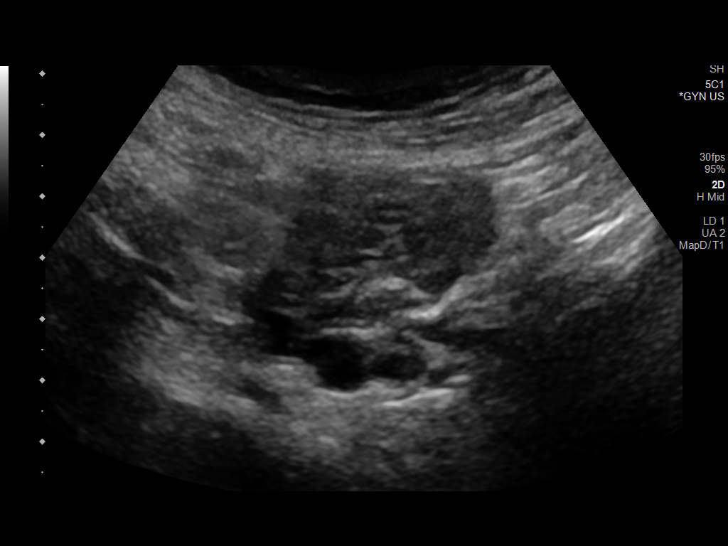
[im 35/69]
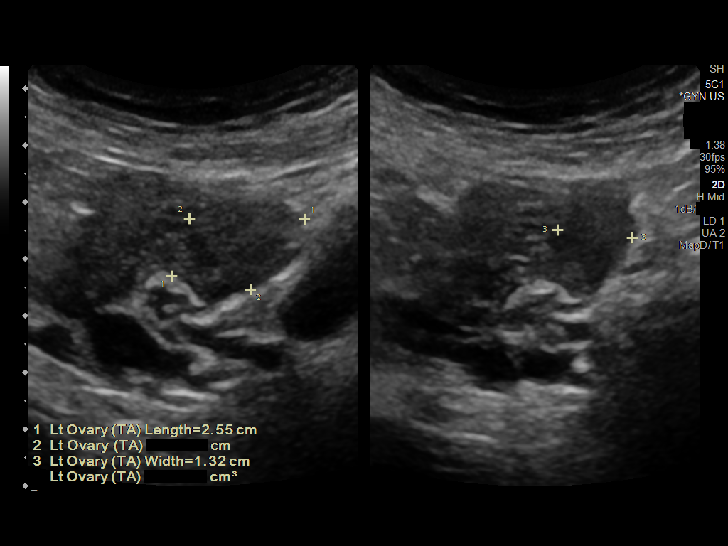
[im 40/69]
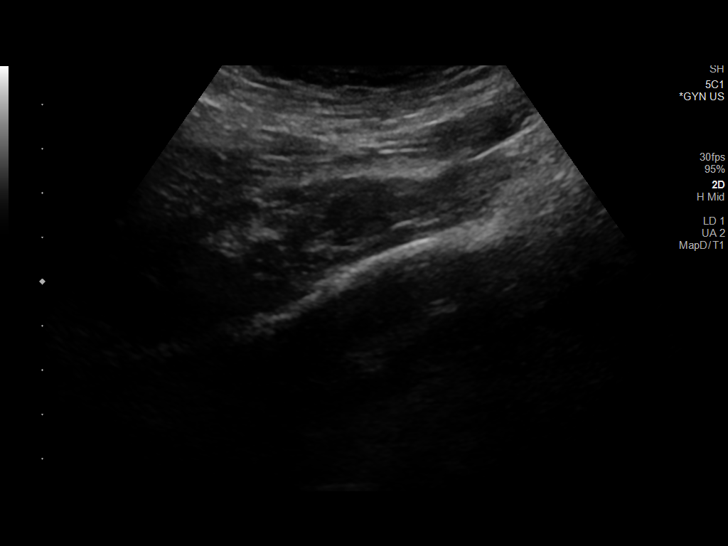
[im 46/69]
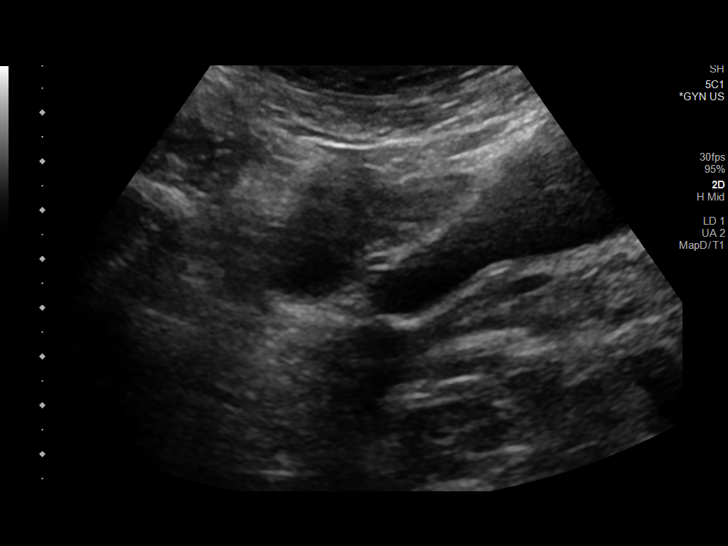
[im 52/69]
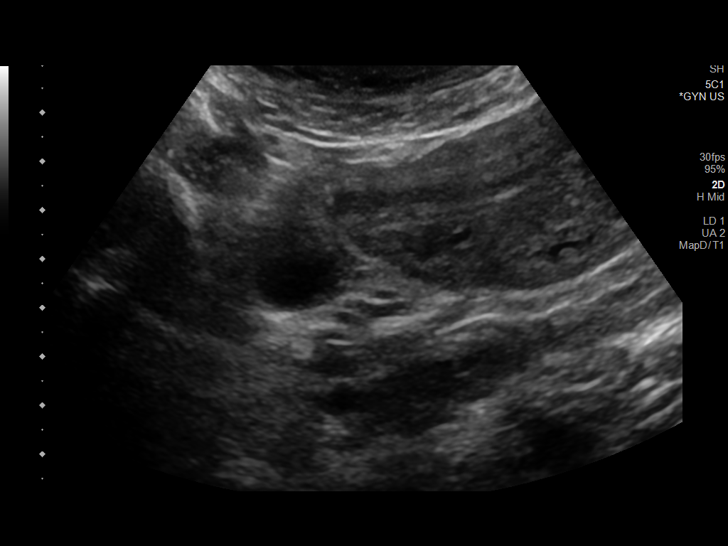
[im 57/69]
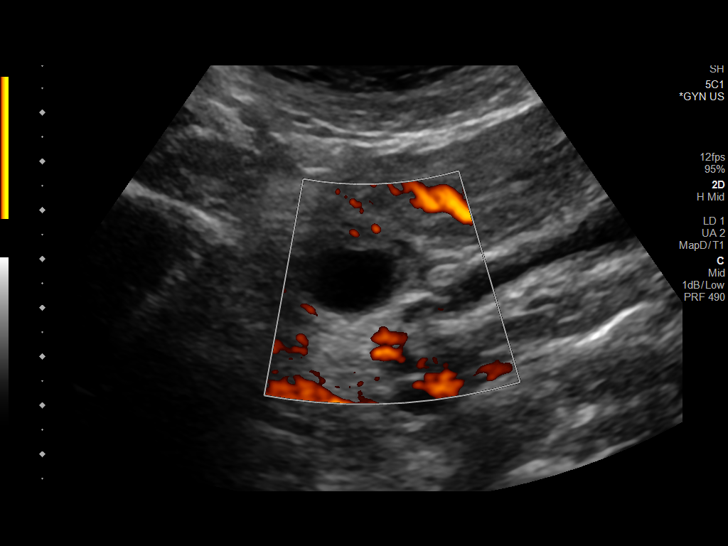
[im 63/69]
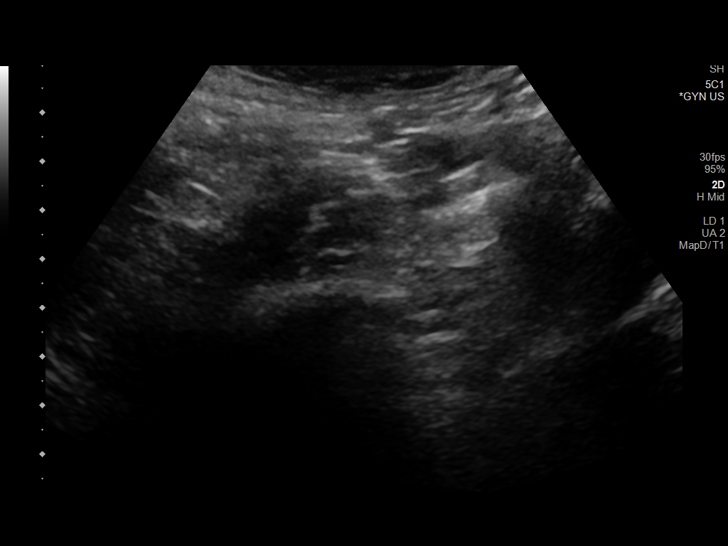
[im 69/69]
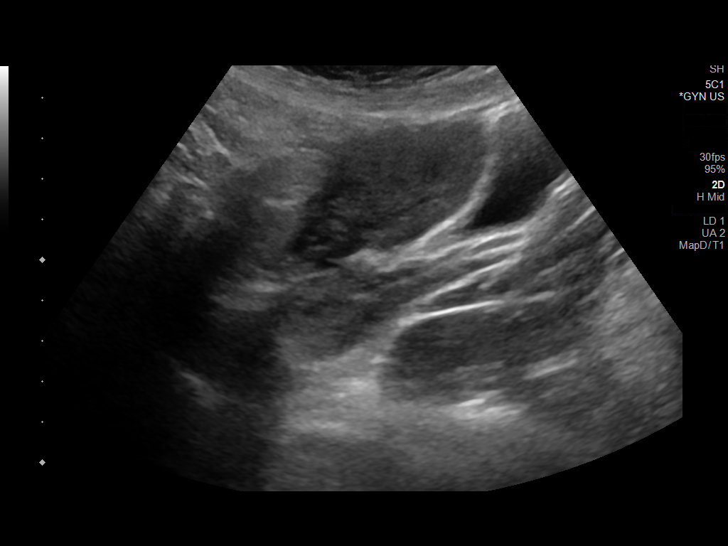

[13 of 25 positions shown; findings below may reference images not displayed]

FINDINGS: Uterus

Measurements: 7.1 x 4.8 x 7.6 cm = volume: 135.8 mL. Uterus is
anteverted. No discrete fibroid or other mass.

Endometrium

Thickness: 4.3 mm. No focal abnormality visualized. No abnormal
vascularity.

Right ovary

Measurements: 3.1 x 2.6 x 2.7 cm = volume: 11.6 mL. 2.7 cm dominant
follicle noted. No other adnexal mass.

Left ovary

Measurements: 2.6 x 1.7 x 1.3 cm = volume: 2.9 mL. Normal
appearance/no adnexal mass.

Pulsed Doppler evaluation demonstrates normal low-resistance
arterial and venous waveforms in both ovaries.

Other: No free fluid within the pelvis.
IMPRESSION: 1. Normal pelvic ultrasound. No evidence for torsion or other acute
abnormality.
2. Endometrial stripe within normal limits measuring 4.3 mm with no
sonographic evidence for retained products of conception. If
bleeding remains unresponsive to hormonal or medical therapy,
sonohysterogram should be considered for focal lesion work-up. (Ref:
Radiological Reasoning: Algorithmic Workup of Abnormal Vaginal
Bleeding with Endovaginal Sonography and Sonohysterography. AJR
[id number]; 191:S68-73).

## 2022-04-29 ENCOUNTER — Ambulatory Visit
Admission: RE | Admit: 2022-04-29 | Discharge: 2022-04-29 | Disposition: A | Discharge status: Home / Self Care | Payer: Commercial Managed Care - HMO | Source: Ambulatory Visit | Attending: Family Medicine | Admitting: Family Medicine

## 2022-04-29 ENCOUNTER — Other Ambulatory Visit: Payer: Self-pay | Admitting: Family Medicine

## 2022-04-29 DIAGNOSIS — N6320 Unspecified lump in the left breast, unspecified quadrant: Secondary | ICD-10-CM

## 2022-04-29 DIAGNOSIS — N6310 Unspecified lump in the right breast, unspecified quadrant: Secondary | ICD-10-CM

## 2022-05-13 ENCOUNTER — Other Ambulatory Visit (HOSPITAL_COMMUNITY)
Admission: RE | Admit: 2022-05-13 | Discharge: 2022-05-13 | Disposition: A | Discharge status: Home / Self Care | Payer: Medicaid Other | Source: Ambulatory Visit | Attending: Obstetrics & Gynecology | Admitting: Obstetrics & Gynecology

## 2022-05-13 ENCOUNTER — Encounter (HOSPITAL_BASED_OUTPATIENT_CLINIC_OR_DEPARTMENT_OTHER): Payer: Self-pay

## 2022-05-13 ENCOUNTER — Ambulatory Visit (INDEPENDENT_AMBULATORY_CARE_PROVIDER_SITE_OTHER): Payer: Medicaid Other | Admitting: *Deleted

## 2022-05-13 ENCOUNTER — Ambulatory Visit (HOSPITAL_BASED_OUTPATIENT_CLINIC_OR_DEPARTMENT_OTHER): Payer: Medicaid Other

## 2022-05-13 VITALS — BP 120/76 | HR 86 | Ht 67.25 in | Wt 146.8 lb

## 2022-05-13 DIAGNOSIS — Z3481 Encounter for supervision of other normal pregnancy, first trimester: Secondary | ICD-10-CM

## 2022-05-13 DIAGNOSIS — O3680X1 Pregnancy with inconclusive fetal viability, fetus 1: Secondary | ICD-10-CM | POA: Diagnosis not present

## 2022-05-13 DIAGNOSIS — Z3A09 9 weeks gestation of pregnancy: Secondary | ICD-10-CM | POA: Diagnosis not present

## 2022-05-13 DIAGNOSIS — O3680X0 Pregnancy with inconclusive fetal viability, not applicable or unspecified: Secondary | ICD-10-CM

## 2022-05-13 LAB — HEPATITIS C ANTIBODY: HCV Ab: NEGATIVE

## 2022-05-13 MED ORDER — PROMETHAZINE HCL 25 MG PO TABS: 25.0000 mg | ORAL_TABLET | Freq: Four times a day (QID) | ORAL | 0 refills | Status: DC | PRN

## 2022-05-13 MED ORDER — COMPLETENATE 29-1 MG PO CHEW: 1.0000 | CHEWABLE_TABLET | Freq: Every day | ORAL | 12 refills | Status: DC

## 2022-05-13 MED ORDER — BLOOD PRESSURE KIT DEVI: 1.0000 | Freq: Once | 0 refills | Status: AC

## 2022-05-13 NOTE — Progress Notes (Signed)
New OB Intake  I explained I am completing New OB Intake today. We discussed EDD of 12/09/22 that is based on LMP of 03/04/22. Pt is G3/P2. I reviewed her allergies, medications, Medical/Surgical/OB history, and appropriate screenings. I informed her of [facility] services. [facility] information placed in AVS. Based on history, this is a low risk pregnancy.  There are no problems to display for this patient.   Concerns addressed today  Delivery Plans Plans to deliver at [facility]. Patient given information for [facility] Healthy [facility] website for more information about [facility]. Patient is not interested in water birth.   MyChart/Babyscripts MyChart access verified. I explained pt will have some visits in office and some virtually. Babyscripts instructions given and order placed.   Blood Pressure Cuff/Weight Scale Blood pressure cuff ordered for patient to pick-up from [employer]. Explained after first prenatal appt pt will check weekly and document in Babyscripts. Patient does have weight scale.  Anatomy US Explained first scheduled [place] will be around 19 weeks. Anatomy US scheduled for 07/15/22 at 1445. Pt notified to arrive at 1430.  Labs Discussed Avelina Laine genetic screening with patient. Would like both Panorama and Horizon drawn at new OB visit. Routine prenatal labs needed. Records release signed for PAP, done at [facility] in 2022.   Social Determinants of Health Food Insecurity: Patient denies food insecurity. WIC Referral: Patient is interested in referral to [facility].  Transportation: Patient denies transportation needs. Childcare: Discussed no children allowed at ultrasound appointments.   Interested in [place]? If yes, send referral and doula dot phrase.   First visit review I reviewed new OB appt with patient. I explained pt will be seen by Sharen Counter, CNm at first visit; encounter routed to appropriate provider. Explained that patient will be seen by pregnancy navigator  following visit with provider.   Harrie Jeans, RN 05/13/2022  2:58 PM

## 2022-05-14 ENCOUNTER — Encounter (HOSPITAL_BASED_OUTPATIENT_CLINIC_OR_DEPARTMENT_OTHER): Payer: Self-pay | Admitting: *Deleted

## 2022-05-14 DIAGNOSIS — Z3480 Encounter for supervision of other normal pregnancy, unspecified trimester: Secondary | ICD-10-CM | POA: Insufficient documentation

## 2022-05-14 LAB — CBC
Hematocrit: 35.5 % (ref 34.0–46.6)
Hemoglobin: 12.3 g/dL (ref 11.1–15.9)
MCH: 29.9 pg (ref 26.6–33.0)
MCHC: 34.6 g/dL (ref 31.5–35.7)
MCV: 86 fL (ref 79–97)
Platelets: 238 10*3/uL (ref 150–450)
RBC: 4.12 x10E6/uL (ref 3.77–5.28)
RDW: 12.3 % (ref 11.7–15.4)
WBC: 8.1 10*3/uL (ref 3.4–10.8)

## 2022-05-14 LAB — RUBELLA SCREEN: Rubella Antibodies, IGG: 1.52 index (ref >0.99)

## 2022-05-14 LAB — HEPATITIS B SURFACE ANTIGEN: Hepatitis B Surface Ag: NEGATIVE

## 2022-05-14 LAB — ABO/RH: Rh Factor: POSITIVE

## 2022-05-14 LAB — CERVICOVAGINAL ANCILLARY ONLY
Chlamydia: NEGATIVE
Comment: NEGATIVE
Comment: NORMAL
Neisseria Gonorrhea: NEGATIVE

## 2022-05-14 LAB — ANTIBODY SCREEN: Antibody Screen: NEGATIVE

## 2022-05-14 LAB — HIV ANTIBODY (ROUTINE TESTING W REFLEX): HIV Screen 4th Generation wRfx: NONREACTIVE

## 2022-05-14 LAB — HEPATITIS C ANTIBODY: Hep C Virus Ab: NONREACTIVE

## 2022-05-14 LAB — RPR: RPR Ser Ql: NONREACTIVE

## 2022-05-15 LAB — URINE CULTURE

## 2022-05-20 ENCOUNTER — Encounter (HOSPITAL_BASED_OUTPATIENT_CLINIC_OR_DEPARTMENT_OTHER): Payer: Self-pay | Admitting: Obstetrics & Gynecology

## 2022-05-20 DIAGNOSIS — Z3480 Encounter for supervision of other normal pregnancy, unspecified trimester: Secondary | ICD-10-CM

## 2022-05-21 LAB — PANORAMA PRENATAL TEST FULL PANEL:PANORAMA TEST PLUS 5 ADDITIONAL MICRODELETIONS: FETAL FRACTION: 9.4

## 2022-05-23 LAB — HORIZON CUSTOM: REPORT SUMMARY: NEGATIVE

## 2022-05-26 ENCOUNTER — Encounter (HOSPITAL_BASED_OUTPATIENT_CLINIC_OR_DEPARTMENT_OTHER): Payer: Medicaid Other | Admitting: Advanced Practice Midwife

## 2022-06-09 ENCOUNTER — Other Ambulatory Visit (HOSPITAL_COMMUNITY)
Admission: RE | Admit: 2022-06-09 | Discharge: 2022-06-09 | Disposition: A | Discharge status: Home / Self Care | Payer: Medicaid Other | Source: Ambulatory Visit | Attending: Advanced Practice Midwife | Admitting: Advanced Practice Midwife

## 2022-06-09 ENCOUNTER — Encounter (HOSPITAL_BASED_OUTPATIENT_CLINIC_OR_DEPARTMENT_OTHER): Payer: Self-pay | Admitting: Advanced Practice Midwife

## 2022-06-09 ENCOUNTER — Ambulatory Visit (INDEPENDENT_AMBULATORY_CARE_PROVIDER_SITE_OTHER): Payer: Medicaid Other | Admitting: Advanced Practice Midwife

## 2022-06-09 VITALS — BP 116/77 | HR 85 | Wt 144.8 lb

## 2022-06-09 DIAGNOSIS — Z3480 Encounter for supervision of other normal pregnancy, unspecified trimester: Secondary | ICD-10-CM

## 2022-06-09 DIAGNOSIS — Z3A15 15 weeks gestation of pregnancy: Secondary | ICD-10-CM | POA: Diagnosis not present

## 2022-06-09 LAB — POCT URINALYSIS DIPSTICK OB
Bilirubin, UA: NEGATIVE
Blood, UA: NEGATIVE
Glucose, UA: NEGATIVE
Ketones, UA: NEGATIVE
Nitrite, UA: NEGATIVE
POC,PROTEIN,UA: NEGATIVE
Spec Grav, UA: 1.01 (ref 1.010–1.025)
Urobilinogen, UA: 0.2 E.U./dL
pH, UA: 5.5 (ref 5.0–8.0)

## 2022-06-09 NOTE — Progress Notes (Signed)
Subjective:   Suzanne Velez is a 27 y.o. [name] at [redacted]w[redacted]d by LMP being seen today for her first obstetrical visit.  Her obstetrical history is significant for  SVD x 2  and has Supervision of other normal pregnancy, antepartum on their problem list.. Patient does intend to breast feed. Pregnancy history fully reviewed.  Patient reports  some mild pelvic and back pain starting 06/08/22 .  HISTORY: OB History  Gravida Para Term Preterm AB Living  3 2 2  0 0 2  SAB IAB Ectopic Multiple Live Births  0 0 0 0 2    # Outcome Date GA Lbr Len/2nd Weight Sex Delivery Anes PTL Lv  3 Current           2 Term 06/25/20 [redacted]w[redacted]d / 00:35 7 lb 15.3 oz (3.609 kg) F Vag-Spont EPI  LIV     Name: SHALEAH, DEGIORGIO     Apgar1: 8  Apgar5: 9  1 Term 10/01/18 [redacted]w[redacted]d 03:15 / 02:17 7 lb 15.5 oz (3.615 kg) F Vag-Spont EPI  LIV     Name: Prickett,GIRL Tilley     Apgar1: 8  Apgar5: 9   Past Medical History:  Diagnosis Date   Allergies    Anemia    Medical history non-contributory    Past Surgical History:  Procedure Laterality Date   NO PAST SURGERIES     Family History  Problem Relation Age of Onset   Cancer Maternal Grandfather        liver   Asthma Paternal Grandmother    Social History   Tobacco Use   Smoking status: Never   Smokeless tobacco: Never  Vaping Use   Vaping Use: Never used  Substance Use Topics   Alcohol use: Never   Drug use: Never   Allergies  Allergen Reactions   Pork-Derived Products Other (See Comments)    Religion   Current Outpatient Medications on File Prior to Visit  Medication Sig Dispense Refill   acetaminophen (TYLENOL) 325 MG tablet Take 2 tablets (650 mg total) by mouth every 4 (four) hours as needed (for pain scale < 4).     prenatal vitamin w/FE, FA (NATACHEW) 29-1 MG CHEW chewable tablet Chew 1 tablet by mouth daily at 12 noon. 30 tablet 12   promethazine (PHENERGAN) 25 MG tablet Take 1 tablet (25 mg total) by mouth every 6 (six) hours as needed  for nausea or vomiting. 30 tablet 0   No current facility-administered medications on file prior to visit.     Indications for ASA therapy (per uptodate) One of the following: Previous pregnancy with preeclampsia, especially early onset and with an adverse outcome No Multifetal gestation No Chronic hypertension No Type 1 or 2 diabetes mellitus No Chronic kidney disease No Autoimmune disease (antiphospholipid syndrome, systemic lupus erythematosus) No   Two or more of the following: Nulliparity No Obesity (body mass index >30 kg/m2) No Family history of preeclampsia in mother or sister No Age ?35 years No Sociodemographic characteristics (African American race, low socioeconomic level) No Personal risk factors (eg, previous pregnancy with low birth weight or small for gestational age infant, previous adverse pregnancy outcome [eg, stillbirth], interval >10 years between pregnancies) No   Indications for early 1 hour GTT (per uptodate)  BMI >25 (>23 in Asian women) AND one of the following BMI 22  Exam   Vitals:   06/09/22 1510  BP: 116/77  Pulse: 85  Weight: 144 lb 12.8 oz (65.7 kg)  Fetal Heart Rate (bpm): 156  Uterus:     Pelvic Exam: Perineum: no hemorrhoids, normal perineum   Vulva: normal external genitalia, no lesions   Vagina:  normal mucosa, normal discharge   Cervix: no lesions and normal, pap smear done.    Adnexa: normal adnexa and no mass, fullness, tenderness   Bony Pelvis: average  System: General: well-developed, well-nourished female in no acute distress   Breast:  normal appearance, no masses or tenderness   Skin: normal coloration and turgor, no rashes   Neurologic: oriented, normal, negative, normal mood   Extremities: normal strength, tone, and muscle mass, ROM of all joints is normal   HEENT PERRLA, extraocular movement intact and sclera clear, anicteric   Mouth/Teeth mucous membranes moist, pharynx normal without lesions and dental hygiene good    Neck supple and no masses   Cardiovascular: regular rate and rhythm   Respiratory:  no respiratory distress, normal breath sounds   Abdomen: soft, non-tender; bowel sounds normal; no masses,  no organomegaly     Assessment:   Pregnancy: [id number] Patient Active Problem List   Diagnosis Date Noted   Supervision of other normal pregnancy, antepartum 05/14/2022     Plan:   1. Supervision of other normal pregnancy, antepartum --Anticipatory guidance about next visits/weeks of pregnancy given.   - Cytology - PAP( [place])  2. [redacted] weeks gestation of pregnancy     Initial labs drawn. Continue prenatal vitamins. New OB lab and NIPS: results reviewed. Ultrasound discussed; fetal anatomic survey: results reviewed. Problem list reviewed and updated. The nature of [facility] with multiple MDs and other Advanced Practice Providers was explained to patient; also emphasized that residents, students are part of our team. Routine obstetric precautions reviewed. Return in about 4 weeks (around 07/07/2022) for As scheduled.   Sharen Counter, CNM 06/09/22 3:32 PM

## 2022-06-12 ENCOUNTER — Encounter (HOSPITAL_BASED_OUTPATIENT_CLINIC_OR_DEPARTMENT_OTHER): Payer: Medicaid Other | Admitting: Obstetrics & Gynecology

## 2022-06-15 LAB — CYTOLOGY - PAP: Diagnosis: NEGATIVE

## 2022-06-23 ENCOUNTER — Encounter (HOSPITAL_BASED_OUTPATIENT_CLINIC_OR_DEPARTMENT_OTHER): Payer: Medicaid Other | Admitting: Advanced Practice Midwife

## 2022-07-01 ENCOUNTER — Encounter (HOSPITAL_BASED_OUTPATIENT_CLINIC_OR_DEPARTMENT_OTHER): Payer: Self-pay | Admitting: Obstetrics & Gynecology

## 2022-07-01 ENCOUNTER — Ambulatory Visit (INDEPENDENT_AMBULATORY_CARE_PROVIDER_SITE_OTHER): Payer: Medicaid Other | Admitting: Obstetrics & Gynecology

## 2022-07-01 VITALS — BP 117/63 | HR 82 | Wt 146.8 lb

## 2022-07-01 DIAGNOSIS — D241 Benign neoplasm of right breast: Secondary | ICD-10-CM

## 2022-07-01 DIAGNOSIS — Z3A16 16 weeks gestation of pregnancy: Secondary | ICD-10-CM

## 2022-07-01 DIAGNOSIS — Z3480 Encounter for supervision of other normal pregnancy, unspecified trimester: Secondary | ICD-10-CM

## 2022-07-01 DIAGNOSIS — Z789 Other specified health status: Secondary | ICD-10-CM

## 2022-07-01 DIAGNOSIS — Z3402 Encounter for supervision of normal first pregnancy, second trimester: Secondary | ICD-10-CM

## 2022-07-01 DIAGNOSIS — D242 Benign neoplasm of left breast: Secondary | ICD-10-CM

## 2022-07-01 NOTE — Progress Notes (Signed)
   PRENATAL VISIT NOTE  Subjective:  Suzanne Velez is a 27 y.o. [name] at [redacted]w[redacted]d being seen today for ongoing prenatal care.  She is currently monitored for the following issues for this low-risk pregnancy and has Supervision of other normal pregnancy, antepartum; Non-English speaking patient; Fibroadenoma of left breast; and Fibroadenoma of right breast on their problem list.  Patient reports no complaints.  Contractions: Not present. Vag. Bleeding: None.  Movement: Absent. Denies leaking of fluid.   The following portions of the patient's history were reviewed and updated as appropriate: allergies, current medications, past family history, past medical history, past social history, past surgical history and problem list.   Objective:   Vitals:   07/01/22 1529  BP: 117/63  Pulse: 82  Weight: 146 lb 12.8 oz (66.6 kg)    Fetal Status: Fetal Heart Rate (bpm): 146   Movement: Absent     General:  Alert, oriented and cooperative. Patient is in no acute distress.  Skin: Skin is warm and dry. No rash noted.   Cardiovascular: Normal heart rate noted  Respiratory: Normal respiratory effort, no problems with respiration noted  Abdomen: Soft, gravid, appropriate for gestational age.  Pain/Pressure: Absent     Pelvic: Cervical exam deferred        Extremities: Normal range of motion.  Edema: None  Mental Status: Normal mood and affect. Normal behavior. Normal judgment and thought content.   Assessment and Plan:  Pregnancy: [name] at [redacted]w[redacted]d 1. Encounter for supervision of normal first pregnancy in second trimester - on PNV - recheck 4 weeks - has anatomy scan scheduled 6/19 - AFP, Serum, Open Spina Bifida  2. [redacted] weeks gestation of pregnancy  3. Fibroadenoma of right breast - has follow up scheduled in October  4. Fibroadenoma of left breast - has follow up scheduled in October  5. Non-English speaking patient - Arabic translator Orlene Erm present for entire  visit   Preterm labor symptoms and general obstetric precautions including but not limited to vaginal bleeding, contractions, leaking of fluid and fetal movement were reviewed in detail with the patient. Please refer to After Visit Summary for other counseling recommendations.   No follow-ups on file.  Future Appointments  Date Time Provider Department Center  07/15/2022  2:45 PM [facility] US4 [facility]  07/21/2022  1:35 PM Jerene Bears, MD [facility]  08/17/2022  2:55 PM Jerene Bears, MD [facility]  09/16/2022  8:35 AM Jerene Bears, MD [facility]  10/01/2022  1:35 PM Jerene Bears, MD [facility]  10/14/2022  1:35 PM Jerene Bears, MD [facility]  10/29/2022  2:15 PM Jerene Bears, MD [facility]  11/02/2022 12:30 PM [facility] [place] 1 [facility] GI-BREAST CE  11/02/2022 12:35 PM GI-[facility]G [place] 1 [facility] GI-BREAST CE  11/10/2022  1:35 PM Jerene Bears, MD [facility]  11/19/2022  1:35 PM Jerene Bears, MD [facility]  11/26/2022  1:35 PM Jerene Bears, MD [facility]  12/03/2022  1:35 PM Jerene Bears, MD [facility]  12/10/2022  1:35 PM Jerene Bears, MD [facility]    Jerene Bears, MD

## 2022-07-04 LAB — AFP, SERUM, OPEN SPINA BIFIDA
AFP MoM: 0.93
AFP Value: 31.8 ng/mL
Gest. Age on Collection Date: 16 weeks
Maternal Age At EDD: 24.9 yr
OSBR Risk 1 IN: 10000
Test Results:: NEGATIVE
Weight: 147 [lb_av]

## 2022-07-15 ENCOUNTER — Other Ambulatory Visit: Payer: Self-pay | Admitting: *Deleted

## 2022-07-15 ENCOUNTER — Ambulatory Visit: Payer: Medicaid Other | Attending: Obstetrics & Gynecology

## 2022-07-15 DIAGNOSIS — Z363 Encounter for antenatal screening for malformations: Secondary | ICD-10-CM | POA: Insufficient documentation

## 2022-07-15 DIAGNOSIS — Z362 Encounter for other antenatal screening follow-up: Secondary | ICD-10-CM

## 2022-07-15 DIAGNOSIS — Z3481 Encounter for supervision of other normal pregnancy, first trimester: Secondary | ICD-10-CM | POA: Diagnosis present

## 2022-07-21 ENCOUNTER — Ambulatory Visit (INDEPENDENT_AMBULATORY_CARE_PROVIDER_SITE_OTHER): Payer: Medicaid Other | Admitting: Obstetrics & Gynecology

## 2022-07-21 VITALS — BP 107/63 | HR 75 | Wt 149.8 lb

## 2022-07-21 DIAGNOSIS — Z789 Other specified health status: Secondary | ICD-10-CM

## 2022-07-21 DIAGNOSIS — D242 Benign neoplasm of left breast: Secondary | ICD-10-CM

## 2022-07-21 DIAGNOSIS — Z3480 Encounter for supervision of other normal pregnancy, unspecified trimester: Secondary | ICD-10-CM

## 2022-07-21 DIAGNOSIS — Z3A19 19 weeks gestation of pregnancy: Secondary | ICD-10-CM

## 2022-07-21 DIAGNOSIS — L539 Erythematous condition, unspecified: Secondary | ICD-10-CM

## 2022-07-21 DIAGNOSIS — D241 Benign neoplasm of right breast: Secondary | ICD-10-CM

## 2022-07-21 DIAGNOSIS — L52 Erythema nodosum: Secondary | ICD-10-CM

## 2022-07-21 MED ORDER — TRIAMCINOLONE ACETONIDE 0.1 % MT PSTE
1.0000 | PASTE | Freq: Two times a day (BID) | OROMUCOSAL | 0 refills | Status: DC
Start: 2022-07-21 — End: 2022-09-16

## 2022-07-21 MED ORDER — HYDROXYZINE HCL 10 MG PO TABS
10.0000 mg | ORAL_TABLET | Freq: Three times a day (TID) | ORAL | 0 refills | Status: DC | PRN
Start: 2022-07-21 — End: 2022-09-16

## 2022-07-21 NOTE — Progress Notes (Signed)
   PRENATAL VISIT NOTE  Subjective:  Suzanne Velez is a 27 y.o. [name] at [redacted]w[redacted]d being seen today for ongoing prenatal care.  She is currently monitored for the following issues for this low-risk pregnancy and has Supervision of other normal pregnancy, antepartum; Non-English speaking patient; Fibroadenoma of left breast; Fibroadenoma of right breast; and Erythema nodosum on their problem list.  Patient reports  red, non itchy nodules on lower extremities .  Contractions: Not present. Vag. Bleeding: None.  Movement: Present. Denies leaking of fluid.   The following portions of the patient's history were reviewed and updated as appropriate: allergies, current medications, past family history, past medical history, past social history, past surgical history and problem list.   Objective:   Vitals:   07/21/22 1348  BP: 107/63  Pulse: 75  Weight: 149 lb 12.8 oz (67.9 kg)    Fetal Status: Fetal Heart Rate (bpm): 138   Movement: Present     General:  Alert, oriented and cooperative. Patient is in no acute distress.  Skin: Skin is warm and dry. No rash noted.   Cardiovascular: Normal heart rate noted  Respiratory: Normal respiratory effort, no problems with respiration noted  Abdomen: Soft, gravid, appropriate for gestational age.  Pain/Pressure: Absent     Pelvic: Cervical exam deferred        Extremities: Normal range of motion.  Edema: None  Mental Status: Normal mood and affect. Normal behavior. Normal judgment and thought content.     Assessment and Plan:  Pregnancy: [name] at [redacted]w[redacted]d 1. Supervision of other normal pregnancy, antepartum - on PNV - recheck 4 weeks  2. [redacted] weeks gestation of pregnancy  3. Skin erythema - this appears to be erythema nodosum.  Will consult with dermatology.  Compression socks recommended.  Reassured pt this is self limiting.  4. Erythema nodosum  5. Fibroadenoma of left breast - f/u u/s done 04/29/2022.  Recheck u/s 6 months  6.  Fibroadenoma of right breast  7. Non-English speaking patient - Arabic translator Kathrynn Ducking was present for entire visit  Preterm labor symptoms and general obstetric precautions including but not limited to vaginal bleeding, contractions, leaking of fluid and fetal movement were reviewed in detail with the patient. Please refer to After Visit Summary for other counseling recommendations.   Return in about 4 weeks (around 08/18/2022).  Future Appointments  Date Time Provider Department Center  08/17/2022  2:55 PM Jerene Bears, MD [facility]  08/20/2022  3:45 PM [facility]  09/16/2022  8:35 AM Jerene Bears, MD [facility]  10/01/2022  1:35 PM Jerene Bears, MD [facility]  10/14/2022  1:35 PM Jerene Bears, MD [facility]  10/29/2022  2:15 PM Jerene Bears, MD [facility]  11/02/2022 12:30 PM [facility] [place] 1 [facility]  11/02/2022 12:35 PM [facility] [place] [facility]  11/10/2022  1:35 PM Jerene Bears, MD [facility]  11/19/2022  1:35 PM Jerene Bears, MD [facility]  11/26/2022  1:35 PM Jerene Bears, MD [facility]  12/03/2022  1:35 PM Jerene Bears, MD [facility]  12/10/2022  1:35 PM Jerene Bears, MD [facility]    Jerene Bears, MD

## 2022-07-24 DIAGNOSIS — L52 Erythema nodosum: Secondary | ICD-10-CM | POA: Insufficient documentation

## 2022-08-17 ENCOUNTER — Encounter (HOSPITAL_BASED_OUTPATIENT_CLINIC_OR_DEPARTMENT_OTHER): Payer: Medicaid Other | Admitting: Obstetrics & Gynecology

## 2022-08-18 ENCOUNTER — Ambulatory Visit (INDEPENDENT_AMBULATORY_CARE_PROVIDER_SITE_OTHER): Payer: Medicaid Other | Admitting: Advanced Practice Midwife

## 2022-08-18 VITALS — BP 110/63 | HR 80 | Wt 156.2 lb

## 2022-08-18 DIAGNOSIS — Z3A23 23 weeks gestation of pregnancy: Secondary | ICD-10-CM

## 2022-08-18 DIAGNOSIS — L52 Erythema nodosum: Secondary | ICD-10-CM

## 2022-08-18 DIAGNOSIS — Z3480 Encounter for supervision of other normal pregnancy, unspecified trimester: Secondary | ICD-10-CM

## 2022-08-18 NOTE — Progress Notes (Signed)
   PRENATAL VISIT NOTE  Subjective:  Suzanne Velez is a 27 y.o. [name] at [redacted]w[redacted]d being seen today for ongoing prenatal care.  She is currently monitored for the following issues for this low-risk pregnancy and has Supervision of other normal pregnancy, antepartum; Non-English speaking patient; Fibroadenoma of left breast; Fibroadenoma of right breast; and Erythema nodosum on their problem list.  Patient reports no complaints.  Contractions: Not present. Vag. Bleeding: None.  Movement: Present. Denies leaking of fluid.   The following portions of the patient's history were reviewed and updated as appropriate: allergies, current medications, past family history, past medical history, past social history, past surgical history and problem list.   Objective:   Vitals:   08/18/22 0914  BP: 110/63  Pulse: 80  Weight: 156 lb 3.2 oz (70.9 kg)    Fetal Status: Fetal Heart Rate (bpm): 141 Fundal Height: 23 cm Movement: Present     General:  Alert, oriented and cooperative. Patient is in no acute distress.  Skin: Skin is warm and dry. No rash noted.   Cardiovascular: Normal heart rate noted  Respiratory: Normal respiratory effort, no problems with respiration noted  Abdomen: Soft, gravid, appropriate for gestational age.  Pain/Pressure: Absent     Pelvic: Cervical exam deferred        Extremities: Normal range of motion.  Edema: None  Mental Status: Normal mood and affect. Normal behavior. Normal judgment and thought content.   Assessment and Plan:  Pregnancy: [name] at [redacted]w[redacted]d 1. Supervision of other normal pregnancy, antepartum --Anticipatory guidance about next visits/weeks of pregnancy given.   2. [redacted] weeks gestation of pregnancy   3. Erythema nodosum --Pt saw PCP, I agree with dx, likely related to increased vascularity of pregnancy.   --No pain or itching --Try elevation, compression if desired --Notify office if pain or worsening erythema   Preterm labor symptoms and  general obstetric precautions including but not limited to vaginal bleeding, contractions, leaking of fluid and fetal movement were reviewed in detail with the patient. Please refer to After Visit Summary for other counseling recommendations.   Return in about 4 weeks (around 09/15/2022) for LOB, As scheduled.  Future Appointments  Date Time Provider Department Center  08/20/2022  3:45 PM [facility]4 [facility]  09/16/2022  8:35 AM Jerene Bears, MD [facility]  10/01/2022  1:35 PM Jerene Bears, MD [facility]  10/14/2022  1:35 PM Jerene Bears, MD [facility]  10/29/2022  2:15 PM Jerene Bears, MD [facility]  11/02/2022 12:30 PM [facility] [place] 1 [facility]  11/02/2022 12:35 PM [facility] [place] 1 [facility]  11/10/2022  1:35 PM Jerene Bears, MD [facility]  11/19/2022  1:35 PM Jerene Bears, MD [facility]  11/26/2022  1:35 PM Jerene Bears, MD [facility]  12/03/2022  1:35 PM Jerene Bears, MD [facility]  12/10/2022  1:35 PM Jerene Bears, MD [facility]    Sharen Counter, CNM

## 2022-08-20 ENCOUNTER — Ambulatory Visit: Payer: Medicaid Other | Attending: Maternal & Fetal Medicine

## 2022-08-20 DIAGNOSIS — Z3A24 24 weeks gestation of pregnancy: Secondary | ICD-10-CM

## 2022-08-20 DIAGNOSIS — Z362 Encounter for other antenatal screening follow-up: Secondary | ICD-10-CM | POA: Insufficient documentation

## 2022-08-21 ENCOUNTER — Other Ambulatory Visit: Payer: Self-pay | Admitting: *Deleted

## 2022-08-21 DIAGNOSIS — Z362 Encounter for other antenatal screening follow-up: Secondary | ICD-10-CM

## 2022-09-09 ENCOUNTER — Encounter (HOSPITAL_BASED_OUTPATIENT_CLINIC_OR_DEPARTMENT_OTHER): Payer: Self-pay | Admitting: Obstetrics & Gynecology

## 2022-09-16 ENCOUNTER — Ambulatory Visit (INDEPENDENT_AMBULATORY_CARE_PROVIDER_SITE_OTHER): Payer: Medicaid Other | Admitting: Obstetrics & Gynecology

## 2022-09-16 VITALS — BP 112/72 | HR 84 | Wt 160.2 lb

## 2022-09-16 DIAGNOSIS — Z23 Encounter for immunization: Secondary | ICD-10-CM

## 2022-09-16 DIAGNOSIS — Z789 Other specified health status: Secondary | ICD-10-CM

## 2022-09-16 DIAGNOSIS — Z3480 Encounter for supervision of other normal pregnancy, unspecified trimester: Secondary | ICD-10-CM

## 2022-09-16 DIAGNOSIS — Z3482 Encounter for supervision of other normal pregnancy, second trimester: Secondary | ICD-10-CM

## 2022-09-16 DIAGNOSIS — Z3A28 28 weeks gestation of pregnancy: Secondary | ICD-10-CM

## 2022-09-16 DIAGNOSIS — L52 Erythema nodosum: Secondary | ICD-10-CM

## 2022-09-16 DIAGNOSIS — Z3483 Encounter for supervision of other normal pregnancy, third trimester: Secondary | ICD-10-CM | POA: Diagnosis not present

## 2022-09-16 DIAGNOSIS — D241 Benign neoplasm of right breast: Secondary | ICD-10-CM

## 2022-09-16 DIAGNOSIS — D242 Benign neoplasm of left breast: Secondary | ICD-10-CM

## 2022-09-16 NOTE — Progress Notes (Signed)
   PRENATAL VISIT NOTE  Subjective:  Suzanne Velez is a 27 y.o. [name] at [redacted]w[redacted]d being seen today for ongoing prenatal care.  She is currently monitored for the following issues for this low-risk pregnancy and has Supervision of other normal pregnancy, antepartum; Non-English speaking patient; Fibroadenoma of left breast; Fibroadenoma of right breast; and Erythema nodosum on their problem list.  Patient reports no complaints.  Contractions: Not present. Vag. Bleeding: None.  Movement: Present. Denies leaking of fluid.   The following portions of the patient's history were reviewed and updated as appropriate: allergies, current medications, past family history, past medical history, past social history, past surgical history and problem list.   Objective:   Vitals:   09/16/22 0920  BP: 112/72  Pulse: 84  Weight: 160 lb 3.2 oz (72.7 kg)    Fetal Status: Fetal Heart Rate (bpm): 128 Fundal Height: 28 cm Movement: Present     General:  Alert, oriented and cooperative. Patient is in no acute distress.  Skin: Skin is warm and dry. No rash noted.   Cardiovascular: Normal heart rate noted  Respiratory: Normal respiratory effort, no problems with respiration noted  Abdomen: Soft, gravid, appropriate for gestational age.  Pain/Pressure: Absent     Pelvic: Cervical exam deferred        Extremities: Normal range of motion.  Edema: None  Mental Status: Normal mood and affect. Normal behavior. Normal judgment and thought content.   Assessment and Plan:  Pregnancy: [name] at [redacted]w[redacted]d 1. Encounter for supervision of other normal pregnancy in third trimester - on PNV - CBC - Glucose Tolerance, 2 Hours w/1 Hour - HIV Antibody (routine testing w rflx) - RPR  2. Non-English speaking patient - Arabic intepreter Fatma [phone] translated all of appt  3. Erythema nodosum - stable  4. Fibroadenoma of left breast - follow up scheduled 10/7  5. Fibroadenoma of right breast - follow up  scheduled 10/7  6. [redacted] weeks gestation of pregnancy   Preterm labor symptoms and general obstetric precautions including but not limited to vaginal bleeding, contractions, leaking of fluid and fetal movement were reviewed in detail with the patient. Please refer to After Visit Summary for other counseling recommendations.   Return in about 4 weeks (around 10/14/2022).  Future Appointments  Date Time Provider Department Center  09/18/2022  2:30 PM [facility]  10/01/2022  1:35 PM Jerene Bears, MD [facility]  10/14/2022  1:35 PM Jerene Bears, MD [facility]  10/29/2022  2:15 PM Jerene Bears, MD [facility]  11/02/2022 12:30 PM [facility] [place] 1 [facility]  11/02/2022 12:35 PM [facility] [place] 1 [facility]  11/10/2022  1:35 PM Leftwich-Kirby, Wilmer Floor, CNM [facility]  11/19/2022  1:35 PM Jerene Bears, MD [facility]  11/26/2022  1:35 PM Jerene Bears, MD [facility]  12/03/2022  1:35 PM Jerene Bears, MD [facility]  12/10/2022  1:35 PM Jerene Bears, MD [facility]    Jerene Bears, MD

## 2022-09-17 LAB — CBC
Hematocrit: 29.4 % — ABNORMAL LOW (ref 34.0–46.6)
Hemoglobin: 10.2 g/dL — ABNORMAL LOW (ref 11.1–15.9)
MCH: 29.5 pg (ref 26.6–33.0)
MCHC: 34.7 g/dL (ref 31.5–35.7)
MCV: 85 fL (ref 79–97)
Platelets: 217 10*3/uL (ref 150–450)
RBC: 3.46 x10E6/uL — ABNORMAL LOW (ref 3.77–5.28)
RDW: 11.7 % (ref 11.7–15.4)
WBC: 9.3 10*3/uL (ref 3.4–10.8)

## 2022-09-17 LAB — GLUCOSE TOLERANCE, 2 HOURS W/ 1HR
Glucose, 1 hour: 120 mg/dL (ref 70–179)
Glucose, 2 hour: 99 mg/dL (ref 70–152)
Glucose, Fasting: 79 mg/dL (ref 70–91)

## 2022-09-17 LAB — RPR: RPR Ser Ql: NONREACTIVE

## 2022-09-17 LAB — HIV ANTIBODY (ROUTINE TESTING W REFLEX): HIV Screen 4th Generation wRfx: NONREACTIVE

## 2022-09-18 ENCOUNTER — Ambulatory Visit: Payer: Medicaid Other | Attending: Obstetrics and Gynecology

## 2022-09-18 ENCOUNTER — Other Ambulatory Visit: Payer: Self-pay | Admitting: *Deleted

## 2022-09-18 DIAGNOSIS — Z362 Encounter for other antenatal screening follow-up: Secondary | ICD-10-CM | POA: Insufficient documentation

## 2022-09-18 DIAGNOSIS — O3500X0 Maternal care for (suspected) central nervous system malformation or damage in fetus, unspecified, not applicable or unspecified: Secondary | ICD-10-CM | POA: Diagnosis not present

## 2022-09-18 DIAGNOSIS — Z3A28 28 weeks gestation of pregnancy: Secondary | ICD-10-CM

## 2022-09-24 ENCOUNTER — Encounter (HOSPITAL_BASED_OUTPATIENT_CLINIC_OR_DEPARTMENT_OTHER): Payer: Self-pay | Admitting: Obstetrics & Gynecology

## 2022-09-24 DIAGNOSIS — O99013 Anemia complicating pregnancy, third trimester: Secondary | ICD-10-CM | POA: Insufficient documentation

## 2022-10-01 ENCOUNTER — Ambulatory Visit (INDEPENDENT_AMBULATORY_CARE_PROVIDER_SITE_OTHER): Payer: Medicaid Other | Admitting: Obstetrics & Gynecology

## 2022-10-01 VITALS — BP 110/70 | HR 91 | Wt 159.6 lb

## 2022-10-01 DIAGNOSIS — D242 Benign neoplasm of left breast: Secondary | ICD-10-CM

## 2022-10-01 DIAGNOSIS — Z3A30 30 weeks gestation of pregnancy: Secondary | ICD-10-CM

## 2022-10-01 DIAGNOSIS — Z789 Other specified health status: Secondary | ICD-10-CM

## 2022-10-01 DIAGNOSIS — Z3480 Encounter for supervision of other normal pregnancy, unspecified trimester: Secondary | ICD-10-CM

## 2022-10-01 DIAGNOSIS — L52 Erythema nodosum: Secondary | ICD-10-CM

## 2022-10-01 DIAGNOSIS — O99013 Anemia complicating pregnancy, third trimester: Secondary | ICD-10-CM

## 2022-10-01 MED ORDER — FERROUS SULFATE 325 (65 FE) MG PO TABS
325.0000 mg | ORAL_TABLET | Freq: Every day | ORAL | 5 refills | Status: DC
Start: 2022-10-01 — End: 2022-12-06

## 2022-10-01 NOTE — Progress Notes (Signed)
   PRENATAL VISIT NOTE  Subjective:  Suzanne Velez is a 27 y.o. [name] at [redacted]w[redacted]d being seen today for ongoing prenatal care.  She is currently monitored for the following issues for this low-risk pregnancy and has Supervision of other normal pregnancy, antepartum; Non-English speaking patient; Fibroadenoma of left breast; Fibroadenoma of right breast; Erythema nodosum; and Anemia complicating pregnancy, third trimester on their problem list.  Patient reports no complaints.  Contractions: Not present. Vag. Bleeding: None.  Movement: Present. Denies leaking of fluid.   The following portions of the patient's history were reviewed and updated as appropriate: allergies, current medications, past family history, past medical history, past social history, past surgical history and problem list.   Objective:   Vitals:   10/01/22 1338  BP: 110/70  Pulse: 91  Weight: 159 lb 9.6 oz (72.4 kg)    Fetal Status: Fetal Heart Rate (bpm): 129 Fundal Height: 28 cm Movement: Present     General:  Alert, oriented and cooperative. Patient is in no acute distress.  Skin: Skin is warm and dry. No rash noted.   Cardiovascular: Normal heart rate noted  Respiratory: Normal respiratory effort, no problems with respiration noted  Abdomen: Soft, gravid, appropriate for gestational age.  Pain/Pressure: Absent     Pelvic: Cervical exam deferred        Extremities: Normal range of motion.  Edema: None  Mental Status: Normal mood and affect. Normal behavior. Normal judgment and thought content.   Assessment and Plan:  Pregnancy: [name] at [redacted]w[redacted]d 1. Supervision of other normal pregnancy, antepartum - on prenatal vitamins - rechekc 2 weeks  2. Anemia during pregnancy in third trimester - ferrous sulfate 325 (65 FE) MG tablet; Take 1 tablet (325 mg total) by mouth daily with breakfast.  Dispense: 30 tablet; Refill: 5  3. [redacted] weeks gestation of pregnancy  4. Erythema nodosum  5. Fibroadenoma of left  breast - has follow up scheduled in October  6. Non-English speaking patient - translator [place], [phone]  Preterm labor symptoms and general obstetric precautions including but not limited to vaginal bleeding, contractions, leaking of fluid and fetal movement were reviewed in detail with the patient. Please refer to After Visit Summary for other counseling recommendations.   Return in about 2 weeks (around 10/15/2022).  Future Appointments  Date Time Provider Department Center  10/14/2022  1:35 PM Jerene Bears, MD [facility]  10/29/2022  2:15 PM Jerene Bears, MD [facility]  10/30/2022  3:30 PM [facility] US3 [facility]  11/02/2022 12:30 PM [facility] [place] 1 [facility]  11/02/2022 12:35 PM [facility] [place] 1 [facility]  11/10/2022  1:35 PM Leftwich-Kirby, Wilmer Floor, CNM [facility]  11/19/2022  1:35 PM Jerene Bears, MD [facility]  11/26/2022  1:35 PM Jerene Bears, MD [facility]  12/03/2022  1:35 PM Jerene Bears, MD [facility]  12/10/2022  1:35 PM Jerene Bears, MD [facility]    Jerene Bears, MD

## 2022-10-01 NOTE — Progress Notes (Deleted)
   PRENATAL VISIT NOTE  Subjective:  Suzanne Velez is a 27 y.o. [name] at [redacted]w[redacted]d being seen today for ongoing prenatal care.  She is currently monitored for the following issues for this {Blank single:19197::"high-risk","low-risk"} pregnancy and has Supervision of other normal pregnancy, antepartum; Non-English speaking patient; Fibroadenoma of left breast; Fibroadenoma of right breast; Erythema nodosum; and Anemia complicating pregnancy, third trimester on their problem list.  Patient reports {[id number]}.   .  .   . Denies leaking of fluid.   The following portions of the patient's history were reviewed and updated as appropriate: allergies, current medications, past family history, past medical history, past social history, past surgical history and problem list.   Objective:  There were no vitals filed for this visit.  Fetal Status:           General:  Alert, oriented and cooperative. Patient is in no acute distress.  Skin: Skin is warm and dry. No rash noted.   Cardiovascular: Normal heart rate noted  Respiratory: Normal respiratory effort, no problems with respiration noted  Abdomen: Soft, gravid, appropriate for gestational age.        Pelvic: {Blank single:19197::"Cervical exam performed in the presence of a chaperone","Cervical exam deferred"}        Extremities: Normal range of motion.     Mental Status: Normal mood and affect. Normal behavior. Normal judgment and thought content.   Assessment and Plan:  Pregnancy: [name] at [redacted]w[redacted]d There are no diagnoses linked to this encounter. {Blank single:19197::"Term","Preterm"} labor symptoms and general obstetric precautions including but not limited to vaginal bleeding, contractions, leaking of fluid and fetal movement were reviewed in detail with the patient. Please refer to After Visit Summary for other counseling recommendations.   No follow-ups on file.  Future Appointments  Date Time Provider Department Center   10/01/2022  1:35 PM Jerene Bears, MD [facility]  10/14/2022  1:35 PM Jerene Bears, MD [facility]  10/29/2022  2:15 PM Jerene Bears, MD [facility]  10/30/2022  3:30 PM [facility] US3 [facility]  11/02/2022 12:30 PM [facility] [place] 1 [facility]  11/02/2022 12:35 PM [facility] [place] 1 [facility]  11/10/2022  1:35 PM Leftwich-Kirby, Wilmer Floor, CNM [facility]  11/19/2022  1:35 PM Jerene Bears, MD [facility]  11/26/2022  1:35 PM Jerene Bears, MD [facility]  12/03/2022  1:35 PM Jerene Bears, MD [facility]  12/10/2022  1:35 PM Jerene Bears, MD [facility]    Hendricks Milo, CMA

## 2022-10-01 NOTE — Patient Instructions (Signed)
[facility] with [facility] about [facility] follow in October. Phone:  [phone]

## 2022-10-13 ENCOUNTER — Encounter (HOSPITAL_BASED_OUTPATIENT_CLINIC_OR_DEPARTMENT_OTHER): Payer: Self-pay | Admitting: Advanced Practice Midwife

## 2022-10-13 ENCOUNTER — Inpatient Hospital Stay (HOSPITAL_COMMUNITY)
Admission: AD | Admit: 2022-10-13 | Discharge: 2022-10-13 | Disposition: A | Discharge status: Home / Self Care | Payer: Medicaid Other | Attending: Family Medicine | Admitting: Family Medicine

## 2022-10-13 ENCOUNTER — Encounter (HOSPITAL_COMMUNITY): Payer: Self-pay | Admitting: Obstetrics and Gynecology

## 2022-10-13 DIAGNOSIS — R42 Dizziness and giddiness: Secondary | ICD-10-CM | POA: Diagnosis present

## 2022-10-13 DIAGNOSIS — D649 Anemia, unspecified: Secondary | ICD-10-CM | POA: Insufficient documentation

## 2022-10-13 DIAGNOSIS — O99013 Anemia complicating pregnancy, third trimester: Secondary | ICD-10-CM | POA: Diagnosis not present

## 2022-10-13 DIAGNOSIS — Z3A31 31 weeks gestation of pregnancy: Secondary | ICD-10-CM | POA: Diagnosis not present

## 2022-10-13 DIAGNOSIS — R002 Palpitations: Secondary | ICD-10-CM | POA: Insufficient documentation

## 2022-10-13 LAB — URINALYSIS, ROUTINE W REFLEX MICROSCOPIC
Bilirubin Urine: NEGATIVE
Glucose, UA: NEGATIVE mg/dL
Hgb urine dipstick: NEGATIVE
Ketones, ur: NEGATIVE mg/dL
Nitrite: NEGATIVE
Protein, ur: NEGATIVE mg/dL
Specific Gravity, Urine: 1.006 (ref 1.005–1.030)
pH: 7 (ref 5.0–8.0)

## 2022-10-13 NOTE — MAU Provider Note (Signed)
Chief Complaint: Dizziness   Event Date/Time   First Provider Initiated Contact with Patient 10/13/22 1703      SUBJECTIVE HPI: Suzanne Velez is a 27 y.o. [name] at [redacted]w[redacted]d by LMP who presents to maternity admissions reporting episode of dizziness earlier today.  Patient had a busy morning and skipped lunch. Had an episode of heart racing, dizziness, and sweatiness that lasted for ~15 minutes. Sat down, and episode resolved. Did not have any CP, SOB, LOC. Notes she had one similar episode at the beginning of pregnancy. Denies any hx of cardiac issues. Feels back to normal at this time.  She denies vaginal bleeding, vaginal itching/burning, urinary symptoms, h/a, n/v, or fever/chills. +FM.  HPI  Past Medical History:  Diagnosis Date   Allergies    Anemia    Past Surgical History:  Procedure Laterality Date   NO PAST SURGERIES     Social History   Socioeconomic History   Marital status: Married    Spouse name: Not on file   Number of children: Not on file   Years of education: Not on file   Highest education level: Not on file  Occupational History   Not on file  Tobacco Use   Smoking status: Never   Smokeless tobacco: Never  Vaping Use   Vaping status: Never Used  Substance and Sexual Activity   Alcohol use: Never   Drug use: Never   Sexual activity: Yes    Birth control/protection: None  Other Topics Concern   Not on file  Social History Narrative   Not on file   Social Determinants of Health   Financial Resource Strain: Low Risk  (05/13/2022)   Overall Financial Resource Strain (CARDIA)    Difficulty of Paying Living Expenses: Not very hard  Food Insecurity: No Food Insecurity (05/13/2022)   Hunger Vital Sign    Worried About Running Out of Food in the Last Year: Never true    Ran Out of Food in the Last Year: Never true  Transportation Needs: No Transportation Needs (05/13/2022)   PRAPARE - Administrator, Civil Service (Medical): No     Lack of Transportation (Non-Medical): No  Physical Activity: Insufficiently Active (05/13/2022)   Exercise Vital Sign    Days of Exercise per Week: 1 day    Minutes of Exercise per Session: 20 min  Stress: No Stress Concern Present (05/13/2022)   [employer] of Occupational Health - Occupational Stress Questionnaire    Feeling of Stress : Not at all  Social Connections: Moderately Integrated (05/13/2022)   Social Connection and Isolation Panel [NHANES]    Frequency of Communication with Friends and Family: More than three times a week    Frequency of Social Gatherings with Friends and Family: Three times a week    Attends Religious Services: 1 to 4 times per year    Active Member of Clubs or Organizations: No    Attends Banker Meetings: Never    Marital Status: Married  Catering manager Violence: Not At Risk (05/13/2022)   Humiliation, Afraid, Rape, and Kick questionnaire    Fear of Current or Ex-Partner: No    Emotionally Abused: No    Physically Abused: No    Sexually Abused: No   No current facility-administered medications on file prior to encounter.   Current Outpatient Medications on File Prior to Encounter  Medication Sig Dispense Refill   esomeprazole (NEXIUM) 10 MG packet Take 10 mg by mouth daily before breakfast.  ferrous sulfate 325 (65 FE) MG tablet Take 1 tablet (325 mg total) by mouth daily with breakfast. 30 tablet 5   prenatal vitamin w/FE, FA (NATACHEW) 29-1 MG CHEW chewable tablet Chew 1 tablet by mouth daily at 12 noon. 30 tablet 12   acetaminophen (TYLENOL) 325 MG tablet Take 2 tablets (650 mg total) by mouth every 4 (four) hours as needed (for pain scale < 4). (Patient not taking: Reported on 08/18/2022)     Allergies  Allergen Reactions   Pork-Derived Products Other (See Comments)    Pt is muslim.      ROS:  Pertinent positives/negatives listed above.  I have reviewed patient's Past Medical Hx, Surgical Hx, Family Hx, Social Hx,  medications and allergies.   Physical Exam  Patient Vitals for the past 24 hrs:  BP Temp Temp src Pulse Resp SpO2 Height Weight  10/13/22 1724 105/64 -- -- (!) 104 -- -- -- --  10/13/22 1723 104/60 -- -- (!) 115 -- -- -- --  10/13/22 1722 (!) 102/52 -- -- 83 -- -- -- --  10/13/22 1720 -- -- -- -- -- 98 % -- --  10/13/22 1715 -- -- -- -- -- 98 % -- --  10/13/22 1710 -- -- -- -- -- 97 % -- --  10/13/22 1705 -- -- -- -- -- 97 % -- --  10/13/22 1700 -- -- -- -- -- 97 % -- --  10/13/22 1641 (!) 94/52 98.1 F (36.7 C) Oral 96 15 97 % 5' 6.93" (1.7 m) 73 kg   Constitutional: Well-developed, well-nourished female in no acute distress.  Cardiovascular: normal rate Respiratory: normal effort GI: Abd soft, non-tender MS: Extremities nontender, trace edema Neurologic: Alert and oriented x 4  FHT:  Baseline 135 , moderate variability, accelerations present, no decelerations Contractions: mild irritability  LAB RESULTS Results for orders placed or performed during the hospital encounter of 10/13/22 (from the past 24 hour(s))  Urinalysis, Routine w reflex microscopic -Urine, Clean Catch     Status: Abnormal   Collection Time: 10/13/22  4:51 PM  Result Value Ref Range   Color, Urine YELLOW YELLOW   APPearance HAZY (A) CLEAR   Specific Gravity, Urine 1.006 1.005 - 1.030   pH 7.0 5.0 - 8.0   Glucose, UA NEGATIVE NEGATIVE mg/dL   Hgb urine dipstick NEGATIVE NEGATIVE   Bilirubin Urine NEGATIVE NEGATIVE   Ketones, ur NEGATIVE NEGATIVE mg/dL   Protein, ur NEGATIVE NEGATIVE mg/dL   Nitrite NEGATIVE NEGATIVE   Leukocytes,Ua SMALL (A) NEGATIVE   RBC / HPF 0-5 0 - 5 RBC/hpf   WBC, UA 0-5 0 - 5 WBC/hpf   Bacteria, UA RARE (A) NONE SEEN   Squamous Epithelial / HPF 6-10 0 - 5 /HPF    O/Positive/-- (04/17 1551)  EKG: HR 93, regular rhythm. Normal axis, intervals.No ST changes. Overall normal EKG.  IMAGING [place] MFM OB FOLLOW UP  Result Date:  09/18/2022 ----------------------------------------------------------------------  OBSTETRICS REPORT                       (Signed Final 09/18/2022 03:20 pm) ---------------------------------------------------------------------- Patient Info  ID #:       [id number]                          D.O.B.:  1995/09/21 (26 yrs)  Name:       Suzanne Velez  Visit Date: 09/18/2022 02:22 pm              First ---------------------------------------------------------------------- Performed By  Attending:        Ma Rings MD         Ref. Address:     [facility]  Performed By:     Isac Sarna        [facility]  Referred By:      Dan Maker MD ---------------------------------------------------------------------- Orders  #  Description                           Code        Ordered By  1  [place] MFM OB FOLLOW UP                   81191.47    Noralee Space ----------------------------------------------------------------------  #  Order #                     Accession #                Episode #  1  [id number]                   [id number]                 [id number] ---------------------------------------------------------------------- Indications  Fetal or maternal indication                   O35.8XX0  [redacted] weeks gestation of pregnancy                Z3A.28  Antenatal follow-up for nonvisualized fetal    Z36.2  anatomy  LR NIPS, Neg AFP, Neg Horizon  2hr GTT wnl ---------------------------------------------------------------------- Fetal Evaluation  Num Of Fetuses:         1  Fetal Heart Rate(bpm):  124  Cardiac Activity:       Observed  Presentation:           Cephalic  Placenta:               Posterior  P. Cord Insertion:      Previously seen  Amniotic Fluid   AFI FV:  Within normal limits  AFI Sum(cm)     %Tile       Largest Pocket(cm)  13.84           44          4.39  RUQ(cm)       RLQ(cm)       LUQ(cm)        LLQ(cm)  3.22          4.39          3.35           2.88 ---------------------------------------------------------------------- Biometry  BPD:      78.1  mm     G. Age:  31w 2d         99  %    CI:        78.06   %    70 - 86                                                          FL/HC:      19.4   %    18.8 - 20.6  HC:      279.7  mm     G. Age:  30w 4d         86  %    HC/AC:      1.10        1.05 - 1.21  AC:      253.5  mm     G. Age:  29w 4d         79  %    FL/BPD:     69.5   %    71 - 87  FL:       54.3  mm     G. Age:  28w 5d         48  %    FL/AC:      21.4   %    20 - 24  LV:        3.8  mm  Est. FW:    1405  gm      3 lb 2 oz     82  % ---------------------------------------------------------------------- OB History  Gravidity:    3         Term:   2  Living:       2 ---------------------------------------------------------------------- Gestational Age  LMP:           28w 2d        Date:  03/04/22                   EDD:   12/09/22  U/S Today:     30w 0d                                        EDD:   11/27/22  Best:          28w 2d     Det. By:  LMP  (03/04/22)          EDD:   12/09/22 ---------------------------------------------------------------------- Anatomy  Cranium:               Appears normal  Aortic Arch:            Previously seen  Cavum:                 Appears normal         Ductal Arch:            Previously seen  Ventricles:            Ventriculomegaly       Diaphragm:              Appears normal                         LT 1.0cm  Choroid Plexus:        Previously seen        Stomach:                Appears normal, left                                                                        sided  Cerebellum:            Appears normal         Abdomen:                Appears normal  Posterior Fossa:       Appears normal          Abdominal Wall:         Previously seen  Nuchal Fold:           Previously seen        Cord Vessels:           Previously seen  Face:                  Orbits and profile     Kidneys:                Appear normal                         previously seen  Lips:                  Previously seen        Bladder:                Appears normal  Thoracic:              Previously seen        Spine:                  Limited views                                                                        previously seen  Heart:                 Previously seen        Upper  Extremities:      Previously seen  RVOT:                  Previously seen        Lower Extremities:      Previously seen  LVOT:                  Previously seen  Other:  VC, 3VV and 3VTV, Heels/feet and open hands/5th digits, Nasal          bone, lenses, maxilla, mandible and falx previously visualized.          Female gender previously seen. Technically difficult due to fetal          position. ---------------------------------------------------------------------- Comments  This patient was seen for a follow up exam as borderline  unilateral ventriculomegaly in the fetal brain was noted on her  prior exam.  She denies any problems since her last exam.  She was informed that the fetal growth and amniotic fluid  level appears appropriate for her gestational age.  Unilateral (left) ventriculomegaly measuring 1.0 cm dilated  continues to be noted on today's exam.  The right lateral  ventricle and the posterior fossa appeared within normal  limits.  The patient was advised that the unilateral ventriculomegaly  noted today may be due to the position of the fetal head.  She  was advised that should ventriculomegaly continue to be  noted during her future exams, her baby will need imaging  studies of the brain after delivery.  Due to borderline ventriculomegaly noted today, a follow-up  exam was scheduled in 6 weeks.  ----------------------------------------------------------------------                   Ma Rings, MD Electronically Signed Final Report   09/18/2022 03:20 pm ----------------------------------------------------------------------    MAU Management/MDM: Orders Placed This Encounter  Procedures   Urinalysis, Routine w reflex microscopic -Urine, Clean Catch   Ambulatory referral to Cardiology   Orthostatic vital signs   ED EKG   Discharge patient    No orders of the defined types were placed in this encounter.   Concern for relative hypoglycemia/dehydration vs arrhythmia. At this time, patient is HDS and well-appearing on exam. Normal EKG. Orthostatics with low normal Bps which appear to be patient's baseline. Encouraged patient to adequately rest, hydrate, and eat regularly through end of pregnancy. Additionally will place referral to [facility] cardiology for Zio patch.  ASSESSMENT 1. Palpitations   2. Anemia complicating pregnancy, third trimester   3. [redacted] weeks gestation of pregnancy     PLAN Discharge home with strict return precautions. Allergies as of 10/13/2022       Reactions   Pork-derived Products Other (See Comments)   Pt is muslim.          Medication List     TAKE these medications    acetaminophen 325 MG tablet Commonly known as: Tylenol Take 2 tablets (650 mg total) by mouth every 4 (four) hours as needed (for pain scale < 4).   esomeprazole 10 MG packet Commonly known as: NEXIUM Take 10 mg by mouth daily before breakfast.   ferrous sulfate 325 (65 FE) MG tablet Take 1 tablet (325 mg total) by mouth daily with breakfast.   prenatal vitamin w/FE, FA 29-1 MG Chew chewable tablet Chew 1 tablet by mouth daily at 12 noon.       Pt declined Radiation protection practitioner. Pt signed for husband to be translator.  Wylene Simmer, MD  OB Fellow 10/13/2022  5:53 PM

## 2022-10-13 NOTE — MAU Note (Signed)
.  Suzanne Velez is a 27 y.o. at [redacted]w[redacted]d here in MAU reporting: she has had dizziness, and sweating and feels "a pulsating in her abd". These symptoms happened at around 12 noon and lasted for about 15 minutes and then went away. Pt denies pain or any symptoms now. Reports positive fetal movement. Denies nausea, vomiting, fever,   Onset of complaint: 12 noon Pain score: 0/10 Vitals:   10/13/22 1641  BP: (!) 94/52  Pulse: 96  Resp: 15  Temp: 98.1 F (36.7 C)  SpO2: 97%     Lab orders placed from triage:

## 2022-10-14 ENCOUNTER — Encounter (HOSPITAL_BASED_OUTPATIENT_CLINIC_OR_DEPARTMENT_OTHER): Payer: Medicaid Other | Admitting: Obstetrics & Gynecology

## 2022-10-15 ENCOUNTER — Ambulatory Visit (HOSPITAL_BASED_OUTPATIENT_CLINIC_OR_DEPARTMENT_OTHER): Payer: Medicaid Other | Admitting: Advanced Practice Midwife

## 2022-10-15 VITALS — BP 102/62 | HR 102 | Wt 161.8 lb

## 2022-10-15 DIAGNOSIS — O99013 Anemia complicating pregnancy, third trimester: Secondary | ICD-10-CM

## 2022-10-15 DIAGNOSIS — Z3480 Encounter for supervision of other normal pregnancy, unspecified trimester: Secondary | ICD-10-CM

## 2022-10-15 DIAGNOSIS — Z3A32 32 weeks gestation of pregnancy: Secondary | ICD-10-CM

## 2022-10-15 NOTE — Progress Notes (Signed)
   PRENATAL VISIT NOTE  Subjective:  Suzanne Velez is a 27 y.o. [name] at [redacted]w[redacted]d being seen today for ongoing prenatal care.  She is currently monitored for the following issues for this low-risk pregnancy and has Supervision of other normal pregnancy, antepartum; Non-English speaking patient; Fibroadenoma of left breast; Fibroadenoma of right breast; Erythema nodosum; and Anemia complicating pregnancy, third trimester on their problem list.  Patient reports no complaints.  Contractions: Not present. Vag. Bleeding: None.  Movement: Present. Denies leaking of fluid.   The following portions of the patient's history were reviewed and updated as appropriate: allergies, current medications, past family history, past medical history, past social history, past surgical history and problem list.   Objective:   Vitals:   10/15/22 0947  BP: 102/62  Pulse: (!) 102  Weight: 161 lb 12.8 oz (73.4 kg)    Fetal Status: Fetal Heart Rate (bpm): 138   Movement: Present     General:  Alert, oriented and cooperative. Patient is in no acute distress.  Skin: Skin is warm and dry. No rash noted.   Cardiovascular: Normal heart rate noted  Respiratory: Normal respiratory effort, no problems with respiration noted  Abdomen: Soft, gravid, appropriate for gestational age.  Pain/Pressure: Absent     Pelvic: Cervical exam deferred        Extremities: Normal range of motion.  Edema: None  Mental Status: Normal mood and affect. Normal behavior. Normal judgment and thought content.   Assessment and Plan:  Pregnancy: [name] at [redacted]w[redacted]d 1. Supervision of other normal pregnancy, antepartum --Anticipatory guidance about next visits/weeks of pregnancy given.   2. Anemia during pregnancy in third trimester --Hgb 10.2 on 8/21, taking oral iron  3. [redacted] weeks gestation of pregnancy   Preterm labor symptoms and general obstetric precautions including but not limited to vaginal bleeding, contractions, leaking  of fluid and fetal movement were reviewed in detail with the patient. Please refer to After Visit Summary for other counseling recommendations.   Return in about 2 weeks (around 10/29/2022) for As scheduled.  Future Appointments  Date Time Provider Department Center  10/27/2022  9:20 AM Thomasene Ripple, DO [facility] None  10/29/2022  2:15 PM Jerene Bears, MD [facility]  10/30/2022  3:30 PM [facility] US3 [facility]  11/02/2022 12:30 PM [facility] [place] 1 [facility]  11/02/2022 12:35 PM [facility] [place] 1 [facility]  11/10/2022  1:35 PM Leftwich-Kirby, Wilmer Floor, CNM [facility]  11/19/2022  1:35 PM Jerene Bears, MD [facility]  11/26/2022  1:35 PM Jerene Bears, MD [facility]  12/03/2022  1:35 PM Jerene Bears, MD [facility]  12/10/2022  1:35 PM Jerene Bears, MD [facility]    Sharen Counter, CNM

## 2022-10-27 ENCOUNTER — Ambulatory Visit: Payer: Medicaid Other | Attending: Cardiology | Admitting: Cardiology

## 2022-10-27 ENCOUNTER — Ambulatory Visit: Payer: Medicaid Other

## 2022-10-27 ENCOUNTER — Encounter: Payer: Self-pay | Admitting: Cardiology

## 2022-10-27 VITALS — BP 98/42 | HR 98 | Ht 67.0 in | Wt 164.0 lb

## 2022-10-27 DIAGNOSIS — R42 Dizziness and giddiness: Secondary | ICD-10-CM | POA: Diagnosis not present

## 2022-10-27 DIAGNOSIS — R55 Syncope and collapse: Secondary | ICD-10-CM | POA: Diagnosis not present

## 2022-10-27 NOTE — Patient Instructions (Addendum)
Medication Instructions:  Your physician recommends that you continue on your current medications as directed. Please refer to the Current Medication list given to you today.  *If you need a refill on your cardiac medications before your next appointment, please call your pharmacy*   Lab Work: None   Testing/Procedures: Your physician has requested that you have an echocardiogram - OB. Echocardiography is a painless test that uses sound waves to create images of your heart. It provides your doctor with information about the size and shape of your heart and how well your heart's chambers and valves are working. This procedure takes approximately one hour. There are no restrictions for this procedure. Please do NOT wear cologne, perfume, aftershave, or lotions (deodorant is allowed). Please arrive 15 minutes prior to your appointment time.  ZIO XT- Long Term Monitor Instructions  Your physician has requested you wear a ZIO patch monitor for 7 days.  This is a single patch monitor. [employer] supplies one patch monitor per enrollment. Additional stickers are not available. Please do not apply patch if you will be having a Nuclear Stress Test,  Echocardiogram, Cardiac CT, MRI, or Chest Xray during the period you would be wearing the  monitor. The patch cannot be worn during these tests. You cannot remove and re-apply the  ZIO XT patch monitor.  Your ZIO patch monitor will be mailed 3 day USPS to your address on file. It may take 3-5 days  to receive your monitor after you have been enrolled.  Once you have received your monitor, please review the enclosed instructions. Your monitor  has already been registered assigning a specific monitor serial # to you.  Billing and Patient Assistance Program Information  We have supplied [employer] with any of your insurance information on file for billing purposes. [employer] offers a sliding scale Patient Assistance Program for patients that do not have   insurance, or whose insurance does not completely cover the cost of the ZIO monitor.  You must apply for the Patient Assistance Program to qualify for this discounted rate.  To apply, please call [employer] at [phone], select option 4, select option 2, ask to apply for  Patient Assistance Program. Meredeth Ide will ask your household income, and how many people  are in your household. They will quote your out-of-pocket cost based on that information.  [employer] will also be able to set up a 36-month, interest-free payment plan if needed.  Applying the monitor   Shave hair from upper left chest.  Hold abrader disc by orange tab. Rub abrader in 40 strokes over the upper left chest as  indicated in your monitor instructions.  Clean area with 4 enclosed alcohol pads. Let dry.  Apply patch as indicated in monitor instructions. Patch will be placed under collarbone on left  side of chest with arrow pointing upward.  Rub patch adhesive wings for 2 minutes. Remove white label marked "1". Remove the white  label marked "2". Rub patch adhesive wings for 2 additional minutes.  While looking in a mirror, press and release button in center of patch. A small green light will  flash 3-4 times. This will be your only indicator that the monitor has been turned on.  Do not shower for the first 24 hours. You may shower after the first 24 hours.  Press the button if you feel a symptom. You will hear a small click. Record Date, Time and  Symptom in the Patient Logbook.  When you are ready to remove the  patch, follow instructions on the last 2 pages of Patient  Logbook. Stick patch monitor onto the last page of Patient Logbook.  Place Patient Logbook in the blue and white box. Use locking tab on box and tape box closed  securely. The blue and white box has prepaid postage on it. Please place it in the mailbox as  soon as possible. Your physician should have your test results approximately 7 days after the  monitor  has been mailed back to [facility].  Call [facility] Customer Care at [phone] if you have questions regarding  your ZIO XT patch monitor. Call them immediately if you see an orange light blinking on your  monitor.  If your monitor falls off in less than 4 days, contact our Monitor department at [phone].  If your monitor becomes loose or falls off after 4 days call [employer] at [phone] for  suggestions on securing your monitor   Follow-Up: At [facility], you and your health needs are our priority.  As part of our continuing mission to provide you with exceptional heart care, we have created designated Provider Care Teams.  These Care Teams include your primary Cardiologist (physician) and Advanced Practice Providers (APPs -  Physician Assistants and Nurse Practitioners) who all work together to provide you with the care you need, when you need it.  Your next appointment:   12 week(s)  Provider:   Thomasene Ripple, DO    Other Instructions

## 2022-10-27 NOTE — Progress Notes (Unsigned)
Enrolled patient for a 7 day Zio XT monitor to be mailed to patients home.  

## 2022-10-27 NOTE — Progress Notes (Unsigned)
Cardio-Obstetrics Clinic  New Evaluation  Date:  10/29/2022   ID:  Suzanne Velez, DOB 1995/04/20, MRN [id number]  PCP:  Karie Georges, MD   [facility] Providers Cardiologist:  Thomasene Ripple, DO  Electrophysiologist:  None       Referring MD: Joanne Gavel, MD   Chief Complaint:   History of Present Illness:    Suzanne Velez is a 27 y.o. female [[name]] who is being seen today for the evaluation of dizziness  at the request of Joanne Gavel, MD.   Interpreter: Norwood Levo [phone]  Currently [redacted] weeks pregnant, presents with palpitations. She reports an episode of dizziness, weakness, and sweating about a week ago, which prompted a hospital visit. She was subsequently referred to our clinic for heart monitoring. She occasionally still experiences dizziness, particularly in the mornings. She has no known family history of heart disease and no reported issues with her previous pregnancies. She has no other significant medical history.  - Originally from [place] - Lives in the [place] for 10 years - Has two daughters  Prior CV Studies Reviewed: The following studies were reviewed today: None today   Past Medical History:  Diagnosis Date   Allergies    Anemia     Past Surgical History:  Procedure Laterality Date   NO PAST SURGERIES        OB History     Gravida  3   Para  2   Term  2   Preterm      AB      Living  2      SAB      IAB      Ectopic      Multiple  0   Live Births  2               Current Medications: Current Meds  Medication Sig   acetaminophen (TYLENOL) 325 MG tablet Take 2 tablets (650 mg total) by mouth every 4 (four) hours as needed (for pain scale < 4).   esomeprazole (NEXIUM) 10 MG packet Take 10 mg by mouth daily before breakfast.   ferrous sulfate 325 (65 FE) MG tablet Take 1 tablet (325 mg total) by mouth daily with breakfast.   prenatal vitamin w/FE, FA (NATACHEW) 29-1  MG CHEW chewable tablet Chew 1 tablet by mouth daily at 12 noon.     Allergies:   Pork-derived products   Social History   Socioeconomic History   Marital status: Married    Spouse name: Not on file   Number of children: Not on file   Years of education: Not on file   Highest education level: Not on file  Occupational History   Not on file  Tobacco Use   Smoking status: Never   Smokeless tobacco: Never  Vaping Use   Vaping status: Never Used  Substance and Sexual Activity   Alcohol use: Never   Drug use: Never   Sexual activity: Yes    Birth control/protection: None  Other Topics Concern   Not on file  Social History Narrative   Not on file   Social Determinants of Health   Financial Resource Strain: Low Risk  (05/13/2022)   Overall Financial Resource Strain (CARDIA)    Difficulty of Paying Living Expenses: Not very hard  Food Insecurity: No Food Insecurity (05/13/2022)   Hunger Vital Sign    Worried About Running Out of Food in the Last Year: Never true  Ran Out of Food in the Last Year: Never true  Transportation Needs: No Transportation Needs (05/13/2022)   PRAPARE - Administrator, Civil Service (Medical): No    Lack of Transportation (Non-Medical): No  Physical Activity: Insufficiently Active (05/13/2022)   Exercise Vital Sign    Days of Exercise per Week: 1 day    Minutes of Exercise per Session: 20 min  Stress: No Stress Concern Present (05/13/2022)   [employer] of Occupational Health - Occupational Stress Questionnaire    Feeling of Stress : Not at all  Social Connections: Moderately Integrated (05/13/2022)   Social Connection and Isolation Panel [NHANES]    Frequency of Communication with Friends and Family: More than three times a week    Frequency of Social Gatherings with Friends and Family: Three times a week    Attends Religious Services: 1 to 4 times per year    Active Member of Clubs or Organizations: No    Attends Tax inspector Meetings: Never    Marital Status: Married      Family History  Problem Relation Age of Onset   Cancer Maternal Grandfather        liver   Asthma Paternal Grandmother       ROS:   Please see the history of present illness.     All other systems reviewed and are negative.   Labs/EKG Reviewed:    EKG:   EKG was not  ordered today.    Recent Labs: 09/16/2022: Hemoglobin 10.2; Platelets 217   Recent Lipid Panel No results found for: "CHOL", "TRIG", "HDL", "CHOLHDL", "LDLCALC", "LDLDIRECT"  Physical Exam:    VS:  BP (!) 98/42 (BP Location: Right Arm)   Pulse 98   Ht 5\' 7"  (1.702 m)   Wt 164 lb (74.4 kg)   LMP 03/04/2022   SpO2 98%   BMI 25.69 kg/m     Wt Readings from Last 3 Encounters:  10/27/22 164 lb (74.4 kg)  10/15/22 161 lb 12.8 oz (73.4 kg)  10/13/22 160 lb 14.4 oz (73 kg)     GEN:  Well nourished, well developed in no acute distress HEENT: Normal NECK: No JVD; No carotid bruits LYMPHATICS: No lymphadenopathy CARDIAC: RRR, no murmurs, rubs, gallops RESPIRATORY:  Clear to auscultation without rales, wheezing or rhonchi  ABDOMEN: Soft, non-tender, non-distended MUSCULOSKELETAL:  No edema; No deformity  SKIN: Warm and dry NEUROLOGIC:  Alert and oriented x 3 PSYCHIATRIC:  Normal affect    Risk Assessment/Risk Calculators:     CARPREG II Risk Prediction Index Score:  1.  The patient's risk for a primary cardiac event is 5%.            ASSESSMENT & PLAN:    Presyncope in Pregnancy [redacted] weeks pregnant, experiencing palpitations for the past 2 weeks, associated with dizziness and sweating. No known family history of heart disease. No issues with previous pregnancies. No other medical history. -Order a heart monitor to be worn for one week. -Schedule an echocardiogram to visualize the heart. -Encourage increased fluid intake. -Follow-up appointment in 12 weeks post-pregnancy, or sooner if any concerning findings on the heart monitor or  echocardiogram.   Patient Instructions  Medication Instructions:  Your physician recommends that you continue on your current medications as directed. Please refer to the Current Medication list given to you today.  *If you need a refill on your cardiac medications before your next appointment, please call your pharmacy*   Lab Work: None  Testing/Procedures: Your physician has requested that you have an echocardiogram - OB. Echocardiography is a painless test that uses sound waves to create images of your heart. It provides your doctor with information about the size and shape of your heart and how well your heart's chambers and valves are working. This procedure takes approximately one hour. There are no restrictions for this procedure. Please do NOT wear cologne, perfume, aftershave, or lotions (deodorant is allowed). Please arrive 15 minutes prior to your appointment time.  ZIO XT- Long Term Monitor Instructions  Your physician has requested you wear a ZIO patch monitor for 7 days.  This is a single patch monitor. [employer] supplies one patch monitor per enrollment. Additional stickers are not available. Please do not apply patch if you will be having a Nuclear Stress Test,  Echocardiogram, Cardiac CT, MRI, or Chest Xray during the period you would be wearing the  monitor. The patch cannot be worn during these tests. You cannot remove and re-apply the  ZIO XT patch monitor.  Your ZIO patch monitor will be mailed 3 day USPS to your address on file. It may take 3-5 days  to receive your monitor after you have been enrolled.  Once you have received your monitor, please review the enclosed instructions. Your monitor  has already been registered assigning a specific monitor serial # to you.  Billing and Patient Assistance Program Information  We have supplied [employer] with any of your insurance information on file for billing purposes. [employer] offers a sliding scale Patient Assistance  Program for patients that do not have  insurance, or whose insurance does not completely cover the cost of the ZIO monitor.  You must apply for the Patient Assistance Program to qualify for this discounted rate.  To apply, please call [employer] at [phone], select option 4, select option 2, ask to apply for  Patient Assistance Program. Meredeth Ide will ask your household income, and how many people  are in your household. They will quote your out-of-pocket cost based on that information.  [employer] will also be able to set up a 52-month, interest-free payment plan if needed.  Applying the monitor   Shave hair from upper left chest.  Hold abrader disc by orange tab. Rub abrader in 40 strokes over the upper left chest as  indicated in your monitor instructions.  Clean area with 4 enclosed alcohol pads. Let dry.  Apply patch as indicated in monitor instructions. Patch will be placed under collarbone on left  side of chest with arrow pointing upward.  Rub patch adhesive wings for 2 minutes. Remove white label marked "1". Remove the white  label marked "2". Rub patch adhesive wings for 2 additional minutes.  While looking in a mirror, press and release button in center of patch. A small green light will  flash 3-4 times. This will be your only indicator that the monitor has been turned on.  Do not shower for the first 24 hours. You may shower after the first 24 hours.  Press the button if you feel a symptom. You will hear a small click. Record Date, Time and  Symptom in the Patient Logbook.  When you are ready to remove the patch, follow instructions on the last 2 pages of Patient  Logbook. Stick patch monitor onto the last page of Patient Logbook.  Place Patient Logbook in the blue and white box. Use locking tab on box and tape box closed  securely. The blue and white box has prepaid postage  on it. Please place it in the mailbox as  soon as possible. Your physician should have your test results  approximately 7 days after the  monitor has been mailed back to [facility].  Call [facility] Customer Care at [phone] if you have questions regarding  your ZIO XT patch monitor. Call them immediately if you see an orange light blinking on your  monitor.  If your monitor falls off in less than 4 days, contact our Monitor department at [phone].  If your monitor becomes loose or falls off after 4 days call [employer] at [phone] for  suggestions on securing your monitor   Follow-Up: At [facility], you and your health needs are our priority.  As part of our continuing mission to provide you with exceptional heart care, we have created designated Provider Care Teams.  These Care Teams include your primary Cardiologist (physician) and Advanced Practice Providers (APPs -  Physician Assistants and Nurse Practitioners) who all work together to provide you with the care you need, when you need it.  Your next appointment:   12 week(s)  Provider:   Thomasene Ripple, DO    Other Instructions      Dispo:  No follow-ups on file.   Medication Adjustments/Labs and Tests Ordered: Current medicines are reviewed at length with the patient today.  Concerns regarding medicines are outlined above.  Tests Ordered: Orders Placed This Encounter  Procedures   LONG TERM MONITOR (3-14 DAYS)   ECHOCARDIOGRAM COMPLETE   Medication Changes: No orders of the defined types were placed in this encounter.

## 2022-10-29 ENCOUNTER — Ambulatory Visit (HOSPITAL_BASED_OUTPATIENT_CLINIC_OR_DEPARTMENT_OTHER): Payer: Medicaid Other | Admitting: Obstetrics & Gynecology

## 2022-10-29 VITALS — BP 119/69 | HR 91 | Wt 165.4 lb

## 2022-10-29 DIAGNOSIS — Z789 Other specified health status: Secondary | ICD-10-CM

## 2022-10-29 DIAGNOSIS — R42 Dizziness and giddiness: Secondary | ICD-10-CM

## 2022-10-29 DIAGNOSIS — O99013 Anemia complicating pregnancy, third trimester: Secondary | ICD-10-CM

## 2022-10-29 DIAGNOSIS — L52 Erythema nodosum: Secondary | ICD-10-CM

## 2022-10-29 DIAGNOSIS — Z3480 Encounter for supervision of other normal pregnancy, unspecified trimester: Secondary | ICD-10-CM

## 2022-10-29 DIAGNOSIS — Z3A34 34 weeks gestation of pregnancy: Secondary | ICD-10-CM

## 2022-10-29 NOTE — Progress Notes (Signed)
   PRENATAL VISIT NOTE  Subjective:  Suzanne Velez is a 27 y.o. [name] at [redacted]w[redacted]d being seen today for ongoing prenatal care.  She is currently monitored for the following issues for this low-risk pregnancy and has Supervision of other normal pregnancy, antepartum; Non-English speaking patient; Fibroadenoma of left breast; Fibroadenoma of right breast; Erythema nodosum; and Anemia complicating pregnancy, third trimester on their problem list.  Patient reports no complaints.  Contractions: Not present. Vag. Bleeding: None.  Movement: Present. Denies leaking of fluid.   The following portions of the patient's history were reviewed and updated as appropriate: allergies, current medications, past family history, past medical history, past social history, past surgical history and problem list.   Objective:   Vitals:   10/29/22 1454  BP: 119/69  Pulse: 91  Weight: 165 lb 6.4 oz (75 kg)    Fetal Status: Fetal Heart Rate (bpm): 141   Movement: Present     General:  Alert, oriented and cooperative. Patient is in no acute distress.  Skin: Skin is warm and dry. No rash noted.   Cardiovascular: Normal heart rate noted  Respiratory: Normal respiratory effort, no problems with respiration noted  Abdomen: Soft, gravid, appropriate for gestational age.  Pain/Pressure: Absent     Pelvic: Cervical exam deferred        Extremities: Normal range of motion.  Edema: None  Mental Status: Normal mood and affect. Normal behavior. Normal judgment and thought content.   Assessment and Plan:  Pregnancy: [name] at [redacted]w[redacted]d 1. [redacted] weeks gestation of pregnancy - on PNV and baby ASA - has follow MFM u/s to reassess fetal hear tomorrow  2. Non-English speaking patient - Print production planner present   3. Erythema nodosum  4. Supervision of other normal pregnancy, antepartum  5. Anemia complicating pregnancy, third trimester - on oral iron  6. Postural dizziness - has seen Dr. Servando Salina and is awaiting  cardiac monitoring.  Echo scheduled 10/14.    Preterm labor symptoms and general obstetric precautions including but not limited to vaginal bleeding, contractions, leaking of fluid and fetal movement were reviewed in detail with the patient. Please refer to After Visit Summary for other counseling recommendations.   Return in about 2 weeks (around 11/12/2022) for GC/Chl/GBS.  Future Appointments  Date Time Provider Department Center  10/30/2022  3:30 PM [facility] US3 [facility]  11/02/2022 12:30 PM [facility] [place] 1 [facility]  11/02/2022 12:35 PM GI-[facility]G [place] 1 [facility]  11/09/2022  3:05 PM [facility] ECHO 5 [facility]  11/10/2022  1:35 PM Leftwich-Kirby, Wilmer Floor, CNM [facility]  11/19/2022  1:35 PM Jerene Bears, MD [facility]  11/26/2022  1:35 PM Jerene Bears, MD [facility]  12/03/2022  1:35 PM Jerene Bears, MD [facility]  12/10/2022  1:35 PM Jerene Bears, MD [facility]  01/14/2023 11:40 AM Thomasene Ripple, DO [facility] None    Jerene Bears, MD

## 2022-10-30 ENCOUNTER — Ambulatory Visit: Payer: Medicaid Other | Attending: Obstetrics

## 2022-10-30 DIAGNOSIS — Z362 Encounter for other antenatal screening follow-up: Secondary | ICD-10-CM

## 2022-10-30 DIAGNOSIS — O99713 Diseases of the skin and subcutaneous tissue complicating pregnancy, third trimester: Secondary | ICD-10-CM | POA: Diagnosis not present

## 2022-10-30 DIAGNOSIS — L52 Erythema nodosum: Secondary | ICD-10-CM | POA: Diagnosis not present

## 2022-10-30 DIAGNOSIS — O3509X0 Maternal care for (suspected) other central nervous system malformation or damage in fetus, not applicable or unspecified: Secondary | ICD-10-CM | POA: Diagnosis not present

## 2022-10-30 DIAGNOSIS — Z3A34 34 weeks gestation of pregnancy: Secondary | ICD-10-CM

## 2022-11-02 ENCOUNTER — Ambulatory Visit
Admission: RE | Admit: 2022-11-02 | Discharge: 2022-11-02 | Disposition: A | Discharge status: Home / Self Care | Payer: Medicaid Other | Source: Ambulatory Visit | Attending: Family Medicine | Admitting: Family Medicine

## 2022-11-02 ENCOUNTER — Other Ambulatory Visit: Payer: Self-pay | Admitting: *Deleted

## 2022-11-02 DIAGNOSIS — N6310 Unspecified lump in the right breast, unspecified quadrant: Secondary | ICD-10-CM

## 2022-11-02 DIAGNOSIS — N6320 Unspecified lump in the left breast, unspecified quadrant: Secondary | ICD-10-CM

## 2022-11-02 DIAGNOSIS — O3509X0 Maternal care for (suspected) other central nervous system malformation or damage in fetus, not applicable or unspecified: Secondary | ICD-10-CM

## 2022-11-09 ENCOUNTER — Ambulatory Visit (HOSPITAL_COMMUNITY): Payer: Medicaid Other | Attending: Cardiology

## 2022-11-09 DIAGNOSIS — R42 Dizziness and giddiness: Secondary | ICD-10-CM

## 2022-11-09 DIAGNOSIS — R55 Syncope and collapse: Secondary | ICD-10-CM | POA: Insufficient documentation

## 2022-11-09 LAB — ECHOCARDIOGRAM COMPLETE
Area-P 1/2: 5.31 cm2
S' Lateral: 2.6 cm

## 2022-11-10 ENCOUNTER — Ambulatory Visit (INDEPENDENT_AMBULATORY_CARE_PROVIDER_SITE_OTHER): Payer: Medicaid Other | Admitting: Advanced Practice Midwife

## 2022-11-10 ENCOUNTER — Other Ambulatory Visit (HOSPITAL_COMMUNITY)
Admission: RE | Admit: 2022-11-10 | Discharge: 2022-11-10 | Disposition: A | Discharge status: Home / Self Care | Payer: Medicaid Other | Source: Ambulatory Visit | Attending: Obstetrics & Gynecology | Admitting: Obstetrics & Gynecology

## 2022-11-10 VITALS — BP 101/62 | HR 96 | Wt 165.4 lb

## 2022-11-10 DIAGNOSIS — Z3A35 35 weeks gestation of pregnancy: Secondary | ICD-10-CM | POA: Diagnosis not present

## 2022-11-10 DIAGNOSIS — Z3480 Encounter for supervision of other normal pregnancy, unspecified trimester: Secondary | ICD-10-CM | POA: Diagnosis present

## 2022-11-10 DIAGNOSIS — R002 Palpitations: Secondary | ICD-10-CM

## 2022-11-10 NOTE — Progress Notes (Signed)
   PRENATAL VISIT NOTE  Subjective:  Suzanne Velez is a 27 y.o. [name] at [redacted]w[redacted]d being seen today for ongoing prenatal care.  She is currently monitored for the following issues for this low-risk pregnancy and has Supervision of other normal pregnancy, antepartum; Non-English speaking patient; Fibroadenoma of left breast; Fibroadenoma of right breast; Erythema nodosum; and Anemia complicating pregnancy, third trimester on their problem list.  Patient reports no complaints.  Contractions: Not present. Vag. Bleeding: None.  Movement: Present. Denies leaking of fluid.   The following portions of the patient's history were reviewed and updated as appropriate: allergies, current medications, past family history, past medical history, past social history, past surgical history and problem list.   Objective:   Vitals:   11/10/22 1401  BP: 101/62  Pulse: 96  Weight: 165 lb 6.4 oz (75 kg)    Fetal Status: Fetal Heart Rate (bpm): 130 Fundal Height: 35 cm Movement: Present     General:  Alert, oriented and cooperative. Patient is in no acute distress.  Skin: Skin is warm and dry. No rash noted.   Cardiovascular: Normal heart rate noted  Respiratory: Normal respiratory effort, no problems with respiration noted  Abdomen: Soft, gravid, appropriate for gestational age.  Pain/Pressure: Absent     Pelvic: Cervical exam deferred        Extremities: Normal range of motion.  Edema: Trace  Mental Status: Normal mood and affect. Normal behavior. Normal judgment and thought content.   Assessment and Plan:  Pregnancy: [name] at [redacted]w[redacted]d 1. Supervision of other normal pregnancy, antepartum --Anticipatory guidance about next visits/weeks of pregnancy given.  - Culture, beta strep (group b only) - Cervicovaginal ancillary only  2. [redacted] weeks gestation of pregnancy   3. Palpitations --Pt saw Dr Servando Salina and has her wearable heart monitor with her today.  She asks for assistance applying the monitor.   With assistance by Raechel Ache, RN, monitor applied to pt left upper chest and turned on as directed.  Pt to wear for 1 week as directed and return by mail for evaluation.   Preterm labor symptoms and general obstetric precautions including but not limited to vaginal bleeding, contractions, leaking of fluid and fetal movement were reviewed in detail with the patient. Please refer to After Visit Summary for other counseling recommendations.   No follow-ups on file.  Future Appointments  Date Time Provider Department Center  11/19/2022  1:35 PM Jerene Bears, MD [facility]  11/26/2022  1:35 PM Jerene Bears, MD [facility]  11/27/2022  3:30 PM [facility] US3 [facility]  12/03/2022  1:35 PM Jerene Bears, MD [facility]  12/10/2022  1:35 PM Jerene Bears, MD [facility]  01/14/2023 11:40 AM Thomasene Ripple, DO [facility] None    Sharen Counter, CNM

## 2022-11-12 LAB — CERVICOVAGINAL ANCILLARY ONLY
Chlamydia: NEGATIVE
Comment: NEGATIVE
Comment: NORMAL
Neisseria Gonorrhea: NEGATIVE

## 2022-11-13 DIAGNOSIS — O3509X0 Maternal care for (suspected) other central nervous system malformation or damage in fetus, not applicable or unspecified: Secondary | ICD-10-CM | POA: Insufficient documentation

## 2022-11-15 LAB — CULTURE, BETA STREP (GROUP B ONLY): Strep Gp B Culture: NEGATIVE

## 2022-11-18 ENCOUNTER — Encounter (HOSPITAL_BASED_OUTPATIENT_CLINIC_OR_DEPARTMENT_OTHER): Payer: Self-pay | Admitting: Obstetrics & Gynecology

## 2022-11-19 ENCOUNTER — Encounter (HOSPITAL_BASED_OUTPATIENT_CLINIC_OR_DEPARTMENT_OTHER): Payer: Self-pay | Admitting: Obstetrics & Gynecology

## 2022-11-19 ENCOUNTER — Ambulatory Visit (HOSPITAL_BASED_OUTPATIENT_CLINIC_OR_DEPARTMENT_OTHER): Payer: Medicaid Other | Admitting: Obstetrics & Gynecology

## 2022-11-19 VITALS — BP 110/79 | HR 116 | Wt 169.4 lb

## 2022-11-19 DIAGNOSIS — Z3480 Encounter for supervision of other normal pregnancy, unspecified trimester: Secondary | ICD-10-CM

## 2022-11-19 DIAGNOSIS — D241 Benign neoplasm of right breast: Secondary | ICD-10-CM

## 2022-11-19 DIAGNOSIS — O3509X0 Maternal care for (suspected) other central nervous system malformation or damage in fetus, not applicable or unspecified: Secondary | ICD-10-CM

## 2022-11-19 DIAGNOSIS — L52 Erythema nodosum: Secondary | ICD-10-CM

## 2022-11-19 DIAGNOSIS — O99013 Anemia complicating pregnancy, third trimester: Secondary | ICD-10-CM

## 2022-11-19 DIAGNOSIS — K219 Gastro-esophageal reflux disease without esophagitis: Secondary | ICD-10-CM

## 2022-11-19 DIAGNOSIS — Z3A37 37 weeks gestation of pregnancy: Secondary | ICD-10-CM

## 2022-11-19 DIAGNOSIS — D242 Benign neoplasm of left breast: Secondary | ICD-10-CM

## 2022-11-19 DIAGNOSIS — Z789 Other specified health status: Secondary | ICD-10-CM

## 2022-11-19 NOTE — Progress Notes (Signed)
   PRENATAL VISIT NOTE  Subjective:  Suzanne Velez is a 27 y.o. [name] at [redacted]w[redacted]d being seen today for ongoing prenatal care.  She is currently monitored for the following issues for this low-risk pregnancy and has Supervision of other normal pregnancy, antepartum; Non-English speaking patient; Fibroadenoma of left breast; Fibroadenoma of right breast; Erythema nodosum; Anemia complicating pregnancy, third trimester; and Cerebral ventriculomegaly of fetus affecting care of mother on their problem list.  Patient reports no complaints.  Contractions: Not present. Vag. Bleeding: None.  Movement: Present. Denies leaking of fluid.   The following portions of the patient's history were reviewed and updated as appropriate: allergies, current medications, past family history, past medical history, past social history, past surgical history and problem list.   Objective:   Vitals:   11/19/22 1432  BP: 110/79  Pulse: (!) 116  Weight: 169 lb 6.4 oz (76.8 kg)    Fetal Status: Fetal Heart Rate (bpm): 159   Movement: Present     General:  Alert, oriented and cooperative. Patient is in no acute distress.  Skin: Skin is warm and dry. No rash noted.   Cardiovascular: Normal heart rate noted  Respiratory: Normal respiratory effort, no problems with respiration noted  Abdomen: Soft, gravid, appropriate for gestational age.  Pain/Pressure: Absent     Pelvic: Cervical exam deferred        Extremities: Normal range of motion.  Edema: None  Mental Status: Normal mood and affect. Normal behavior. Normal judgment and thought content.   Assessment and Plan:  Pregnancy: [name] at [redacted]w[redacted]d 1. Supervision of other normal pregnancy, antepartum - recheck 1 week - on PNV  2. [redacted] weeks gestation of pregnancy  3. 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 Fibroadenoma of right breast - had follow up imaging 10/2022.  Repeat 6 months.  5.  Fibroadenoma of left breast - had follow up imaging 10/2022.  Repeat 6 months.  6. Cerebral ventriculomegaly of fetus affecting care of mother - has follow with MFM 11/27/2022.  Was borderline ventriculomegaly.  7. Erythema nodosum - has with each pregnancy  8. Anemia complicating pregnancy, third trimester - on oral iron  9. Gastroesophageal reflux disease without esophagitis - on Nexium  Term labor symptoms and general obstetric precautions including but not limited to vaginal bleeding, contractions, leaking of fluid and fetal movement were reviewed in detail with the patient. Please refer to After Visit Summary for other counseling recommendations.   Return in about 1 week (around 11/26/2022).  Future Appointments  Date Time Provider Department Center  11/26/2022  1:35 PM Jerene Bears, MD [facility]  11/27/2022  3:30 PM [facility] US3 [facility]  12/03/2022  1:35 PM Jerene Bears, MD [facility]  12/10/2022  1:35 PM Jerene Bears, MD [facility]  01/14/2023 11:40 AM Thomasene Ripple, DO [facility] None    Jerene Bears, MD

## 2022-11-26 ENCOUNTER — Ambulatory Visit (HOSPITAL_BASED_OUTPATIENT_CLINIC_OR_DEPARTMENT_OTHER): Payer: Medicaid Other | Admitting: Obstetrics & Gynecology

## 2022-11-26 VITALS — BP 124/74 | HR 97 | Wt 170.6 lb

## 2022-11-26 DIAGNOSIS — L52 Erythema nodosum: Secondary | ICD-10-CM

## 2022-11-26 DIAGNOSIS — Z3A38 38 weeks gestation of pregnancy: Secondary | ICD-10-CM

## 2022-11-26 DIAGNOSIS — D241 Benign neoplasm of right breast: Secondary | ICD-10-CM

## 2022-11-26 DIAGNOSIS — Z3480 Encounter for supervision of other normal pregnancy, unspecified trimester: Secondary | ICD-10-CM

## 2022-11-26 DIAGNOSIS — O3509X0 Maternal care for (suspected) other central nervous system malformation or damage in fetus, not applicable or unspecified: Secondary | ICD-10-CM

## 2022-11-26 DIAGNOSIS — D242 Benign neoplasm of left breast: Secondary | ICD-10-CM

## 2022-11-26 DIAGNOSIS — Z789 Other specified health status: Secondary | ICD-10-CM

## 2022-11-26 DIAGNOSIS — O99013 Anemia complicating pregnancy, third trimester: Secondary | ICD-10-CM

## 2022-11-26 NOTE — Progress Notes (Signed)
   PRENATAL VISIT NOTE  Subjective:  Suzanne Velez is a 27 y.o. [name] at [redacted]w[redacted]d being seen today for ongoing prenatal care.  She is currently monitored for the following issues for this low-risk pregnancy and has Supervision of other normal pregnancy, antepartum; Non-English speaking patient; Fibroadenoma of left breast; Fibroadenoma of right breast; Erythema nodosum; Anemia complicating pregnancy, third trimester; and Cerebral ventriculomegaly of fetus affecting care of mother on their problem list.  Patient reports no complaints.  Contractions: Not present. Vag. Bleeding: None.  Movement: Present. Denies leaking of fluid.   The following portions of the patient's history were reviewed and updated as appropriate: allergies, current medications, past family history, past medical history, past social history, past surgical history and problem list.   Objective:   Vitals:   11/26/22 1349  BP: 124/74  Pulse: 97  Weight: 170 lb 9.6 oz (77.4 kg)    Fetal Status: Fetal Heart Rate (bpm): 128   Movement: Present     General:  Alert, oriented and cooperative. Patient is in no acute distress.  Skin: Skin is warm and dry. No rash noted.   Cardiovascular: Normal heart rate noted  Respiratory: Normal respiratory effort, no problems with respiration noted  Abdomen: Soft, gravid, appropriate for gestational age.  Pain/Pressure: Present     Pelvic: Cervical exam performed in the presence of a chaperone        Extremities: Normal range of motion.  Edema: None  Mental Status: Normal mood and affect. Normal behavior. Normal judgment and thought content.   Assessment and Plan:  Pregnancy: [name] at [redacted]w[redacted]d 1. Supervision of other normal pregnancy, antepartum - on PNV - recheck 1 week  2. [redacted] weeks gestation of pregnancy  3. Fibroadenoma of left breast - had imaging early October and will have follow up 6 weeks  4. Fibroadenoma of right breast - had imaging early October and will have  follow up 6 weeks  5. Cerebral ventriculomegaly of fetus affecting care of mother - has MFM appt tomorrow  6. Erythema nodosum  7. Non-English speaking patient - [employer] translator present for entire appointment  8. Anemia complicating pregnancy, third trimester - on oral iron   Term labor symptoms and general obstetric precautions including but not limited to vaginal bleeding, contractions, leaking of fluid and fetal movement were reviewed in detail with the patient. Please refer to After Visit Summary for other counseling recommendations.   Return in about 1 week (around 12/03/2022).  Future Appointments  Date Time Provider Department Center  11/27/2022  3:30 PM [facility] US3 [facility]  12/03/2022  1:35 PM Jerene Bears, MD [facility]  12/10/2022  1:35 PM Jerene Bears, MD [facility]  01/14/2023 11:40 AM Thomasene Ripple, DO [facility] None    Jerene Bears, MD

## 2022-11-27 ENCOUNTER — Ambulatory Visit: Payer: Medicaid Other | Attending: Maternal & Fetal Medicine

## 2022-11-27 DIAGNOSIS — O3509X0 Maternal care for (suspected) other central nervous system malformation or damage in fetus, not applicable or unspecified: Secondary | ICD-10-CM | POA: Diagnosis present

## 2022-11-27 DIAGNOSIS — Z3A38 38 weeks gestation of pregnancy: Secondary | ICD-10-CM | POA: Diagnosis not present

## 2022-12-03 ENCOUNTER — Ambulatory Visit (HOSPITAL_BASED_OUTPATIENT_CLINIC_OR_DEPARTMENT_OTHER): Payer: Medicaid Other | Admitting: Obstetrics & Gynecology

## 2022-12-03 ENCOUNTER — Encounter (HOSPITAL_BASED_OUTPATIENT_CLINIC_OR_DEPARTMENT_OTHER): Payer: Self-pay | Admitting: Obstetrics & Gynecology

## 2022-12-03 ENCOUNTER — Other Ambulatory Visit (HOSPITAL_BASED_OUTPATIENT_CLINIC_OR_DEPARTMENT_OTHER): Payer: Self-pay | Admitting: Obstetrics & Gynecology

## 2022-12-03 VITALS — BP 106/61 | HR 113 | Wt 172.0 lb

## 2022-12-03 DIAGNOSIS — Z789 Other specified health status: Secondary | ICD-10-CM

## 2022-12-03 DIAGNOSIS — D241 Benign neoplasm of right breast: Secondary | ICD-10-CM

## 2022-12-03 DIAGNOSIS — O3509X0 Maternal care for (suspected) other central nervous system malformation or damage in fetus, not applicable or unspecified: Secondary | ICD-10-CM | POA: Diagnosis not present

## 2022-12-03 DIAGNOSIS — O3663X0 Maternal care for excessive fetal growth, third trimester, not applicable or unspecified: Secondary | ICD-10-CM

## 2022-12-03 DIAGNOSIS — Z3490 Encounter for supervision of normal pregnancy, unspecified, unspecified trimester: Secondary | ICD-10-CM

## 2022-12-03 DIAGNOSIS — L52 Erythema nodosum: Secondary | ICD-10-CM | POA: Diagnosis not present

## 2022-12-03 DIAGNOSIS — D242 Benign neoplasm of left breast: Secondary | ICD-10-CM

## 2022-12-03 DIAGNOSIS — Z3A39 39 weeks gestation of pregnancy: Secondary | ICD-10-CM | POA: Diagnosis not present

## 2022-12-03 DIAGNOSIS — Z3480 Encounter for supervision of other normal pregnancy, unspecified trimester: Secondary | ICD-10-CM

## 2022-12-03 NOTE — Progress Notes (Signed)
   PRENATAL VISIT NOTE  Subjective:  Suzanne Velez is a 27 y.o. [name] at [redacted]w[redacted]d being seen today for ongoing prenatal care.  She is currently monitored for the following issues for this low-risk pregnancy and has Supervision of other normal pregnancy, antepartum; Non-English speaking patient; Fibroadenoma of left breast; Fibroadenoma of right breast; Erythema nodosum; Anemia complicating pregnancy, third trimester; and Cerebral ventriculomegaly of fetus affecting care of mother on their problem list.  Patient reports no complaints.  Contractions: Irregular. Vag. Bleeding: None.  Movement: Present. Denies leaking of fluid.   The following portions of the patient's history were reviewed and updated as appropriate: allergies, current medications, past family history, past medical history, past social history, past surgical history and problem list.   Objective:   Vitals:   12/03/22 1355  BP: 106/61  Pulse: (!) 113  Weight: 172 lb (78 kg)    Fetal Status: Fetal Heart Rate (bpm): 125 Fundal Height: 39 cm Movement: Present  Presentation: Vertex  General:  Alert, oriented and cooperative. Patient is in no acute distress.  Skin: Skin is warm and dry. No rash noted.   Cardiovascular: Normal heart rate noted  Respiratory: Normal respiratory effort, no problems with respiration noted  Abdomen: Soft, gravid, appropriate for gestational age.  Pain/Pressure: Present     Pelvic: Cervical exam performed in the presence of a chaperone Dilation: 3 Effacement (%): 40 Station: -3  Extremities: Normal range of motion.  Edema: None  Mental Status: Normal mood and affect. Normal behavior. Normal judgment and thought content.   Assessment and Plan:  Pregnancy: [name] at [redacted]w[redacted]d 1. Supervision of other normal pregnancy, antepartum - on PNV  2. [redacted] weeks gestation of pregnancy  3. Excessive fetal growth affecting management of pregnancy in third trimester, single or unspecified fetus -  induction scheduled for tomorrow  4. Cerebral ventriculomegaly of fetus affecting care of mother - Pt aware. NICU needs to be present for delivery.    5. Erythema nodosum - has had with each pregnancy  6. Non-English speaking patient - Suzanne Velez  7. Fibroadenoma of left breast - has done follow up and repeat 6 months scheduled  8. Fibroadenoma of right breast - has done follow up and repeat 6 months scheduled  Term labor symptoms and general obstetric precautions including but not limited to vaginal bleeding, contractions, leaking of fluid and fetal movement were reviewed in detail with the patient. Please refer to After Visit Summary for other counseling recommendations.   No follow-ups on file.  Future Appointments  Date Time Provider Department Center  12/04/2022  7:00 AM [facility] SCHED ROOM [facility] None  01/14/2023 11:40 AM Tobb, Lavona Mound, DO [facility] None  01/18/2023  1:35 PM Lo, Toma Aran, CNM [facility]    Jerene Bears, MD

## 2022-12-04 ENCOUNTER — Other Ambulatory Visit: Payer: Self-pay

## 2022-12-04 ENCOUNTER — Inpatient Hospital Stay (HOSPITAL_COMMUNITY): Payer: Medicaid Other

## 2022-12-04 ENCOUNTER — Inpatient Hospital Stay (HOSPITAL_COMMUNITY): Payer: Medicaid Other | Admitting: Anesthesiology

## 2022-12-04 ENCOUNTER — Encounter (HOSPITAL_COMMUNITY): Payer: Self-pay | Admitting: Obstetrics & Gynecology

## 2022-12-04 ENCOUNTER — Inpatient Hospital Stay (HOSPITAL_COMMUNITY)
Admission: RE | Admit: 2022-12-04 | Discharge: 2022-12-06 | DRG: 807 | Disposition: A | Discharge status: Home / Self Care | Payer: Medicaid Other | Attending: Obstetrics and Gynecology | Admitting: Obstetrics and Gynecology

## 2022-12-04 DIAGNOSIS — Z825 Family history of asthma and other chronic lower respiratory diseases: Secondary | ICD-10-CM

## 2022-12-04 DIAGNOSIS — O3509X0 Maternal care for (suspected) other central nervous system malformation or damage in fetus, not applicable or unspecified: Principal | ICD-10-CM | POA: Diagnosis present

## 2022-12-04 DIAGNOSIS — Z3A39 39 weeks gestation of pregnancy: Secondary | ICD-10-CM

## 2022-12-04 DIAGNOSIS — O3663X0 Maternal care for excessive fetal growth, third trimester, not applicable or unspecified: Secondary | ICD-10-CM | POA: Diagnosis present

## 2022-12-04 DIAGNOSIS — O3663X1 Maternal care for excessive fetal growth, third trimester, fetus 1: Principal | ICD-10-CM | POA: Diagnosis present

## 2022-12-04 DIAGNOSIS — Z3490 Encounter for supervision of normal pregnancy, unspecified, unspecified trimester: Secondary | ICD-10-CM

## 2022-12-04 DIAGNOSIS — O9902 Anemia complicating childbirth: Secondary | ICD-10-CM | POA: Diagnosis present

## 2022-12-04 LAB — RPR: RPR Ser Ql: NONREACTIVE

## 2022-12-04 LAB — CBC
HCT: 29.2 % — ABNORMAL LOW (ref 36.0–46.0)
Hemoglobin: 9.3 g/dL — ABNORMAL LOW (ref 12.0–15.0)
MCH: 25.1 pg — ABNORMAL LOW (ref 26.0–34.0)
MCHC: 31.8 g/dL (ref 30.0–36.0)
MCV: 78.9 fL — ABNORMAL LOW (ref 80.0–100.0)
Platelets: 185 10*3/uL (ref 150–400)
RBC: 3.7 MIL/uL — ABNORMAL LOW (ref 3.87–5.11)
RDW: 13.6 % (ref 11.5–15.5)
WBC: 10.5 10*3/uL (ref 4.0–10.5)
nRBC: 0 % (ref 0.0–0.2)

## 2022-12-04 LAB — TYPE AND SCREEN
ABO/RH(D): O POS
Antibody Screen: NEGATIVE

## 2022-12-04 MED ORDER — EPHEDRINE 5 MG/ML INJ: 10.0000 mg | INTRAVENOUS | Status: DC | PRN

## 2022-12-04 MED ORDER — OXYCODONE-ACETAMINOPHEN 5-325 MG PO TABS: 1.0000 | ORAL_TABLET | ORAL | Status: DC | PRN

## 2022-12-04 MED ORDER — OXYTOCIN-SODIUM CHLORIDE 30-0.9 UT/500ML-% IV SOLN: 2.5000 [IU]/h | INTRAVENOUS | Status: DC

## 2022-12-04 MED ORDER — FLEET ENEMA RE ENEM: 1.0000 | ENEMA | RECTAL | Status: DC | PRN

## 2022-12-04 MED ORDER — PHENYLEPHRINE 80 MCG/ML (10ML) SYRINGE FOR IV PUSH (FOR BLOOD PRESSURE SUPPORT)
80.0000 ug | PREFILLED_SYRINGE | INTRAVENOUS | Status: DC | PRN
  Filled 2022-12-04: qty 10

## 2022-12-04 MED ORDER — ACETAMINOPHEN 325 MG PO TABS: 650.0000 mg | ORAL_TABLET | ORAL | Status: DC | PRN

## 2022-12-04 MED ORDER — MISOPROSTOL 25 MCG QUARTER TABLET
25.0000 ug | ORAL_TABLET | Freq: Once | ORAL | Status: AC
  Administered 2022-12-04: 25 ug via VAGINAL
  Filled 2022-12-04: qty 1

## 2022-12-04 MED ORDER — LIDOCAINE HCL (PF) 1 % IJ SOLN: 30.0000 mL | INTRAMUSCULAR | Status: DC | PRN

## 2022-12-04 MED ORDER — OXYTOCIN BOLUS FROM INFUSION
333.0000 mL | Freq: Once | INTRAVENOUS | Status: DC
  Administered 2022-12-05: 333 mL via INTRAVENOUS

## 2022-12-04 MED ORDER — ONDANSETRON HCL 4 MG/2ML IJ SOLN
4.0000 mg | Freq: Four times a day (QID) | INTRAMUSCULAR | Status: DC | PRN
  Administered 2022-12-04: 4 mg via INTRAVENOUS
  Filled 2022-12-04: qty 2

## 2022-12-04 MED ORDER — DIPHENHYDRAMINE HCL 50 MG/ML IJ SOLN: 12.5000 mg | INTRAMUSCULAR | Status: DC | PRN

## 2022-12-04 MED ORDER — MISOPROSTOL 50MCG HALF TABLET
50.0000 ug | ORAL_TABLET | Freq: Once | ORAL | Status: AC
  Administered 2022-12-04: 50 ug via ORAL
  Filled 2022-12-04: qty 1

## 2022-12-04 MED ORDER — PHENYLEPHRINE 80 MCG/ML (10ML) SYRINGE FOR IV PUSH (FOR BLOOD PRESSURE SUPPORT)
80.0000 ug | PREFILLED_SYRINGE | INTRAVENOUS | Status: DC | PRN
  Administered 2022-12-04 (×2): 80 ug via INTRAVENOUS

## 2022-12-04 MED ORDER — OXYTOCIN-SODIUM CHLORIDE 30-0.9 UT/500ML-% IV SOLN
1.0000 m[IU]/min | INTRAVENOUS | Status: DC
  Filled 2022-12-04: qty 500

## 2022-12-04 MED ORDER — LACTATED RINGERS IV SOLN: 500.0000 mL | Freq: Once | INTRAVENOUS | Status: DC

## 2022-12-04 MED ORDER — LACTATED RINGERS IV SOLN: INTRAVENOUS | Status: DC

## 2022-12-04 MED ORDER — TERBUTALINE SULFATE 1 MG/ML IJ SOLN: 0.2500 mg | Freq: Once | INTRAMUSCULAR | Status: DC | PRN

## 2022-12-04 MED ORDER — FENTANYL-BUPIVACAINE-NACL 0.5-0.125-0.9 MG/250ML-% EP SOLN: 12.0000 mL/h | EPIDURAL | Status: DC | PRN

## 2022-12-04 MED ORDER — OXYCODONE-ACETAMINOPHEN 5-325 MG PO TABS: 2.0000 | ORAL_TABLET | ORAL | Status: DC | PRN

## 2022-12-04 MED ORDER — LACTATED RINGERS IV SOLN
500.0000 mL | INTRAVENOUS | Status: DC | PRN
  Administered 2022-12-04: 1000 mL via INTRAVENOUS

## 2022-12-04 MED ORDER — FENTANYL-BUPIVACAINE-NACL 0.5-0.125-0.9 MG/250ML-% EP SOLN
12.0000 mL/h | EPIDURAL | Status: DC | PRN
  Administered 2022-12-04: 12 mL/h via EPIDURAL
  Filled 2022-12-04: qty 250

## 2022-12-04 MED ORDER — LIDOCAINE HCL (PF) 1 % IJ SOLN
INTRAMUSCULAR | Status: DC | PRN
  Administered 2022-12-04: 8 mL via EPIDURAL

## 2022-12-04 MED ORDER — SOD CITRATE-CITRIC ACID 500-334 MG/5ML PO SOLN: 30.0000 mL | ORAL | Status: DC | PRN

## 2022-12-04 NOTE — Anesthesia Procedure Notes (Signed)
Epidural Patient location during procedure: OB Start time: 12/04/2022 5:57 PM End time: 12/04/2022 6:07 PM  Staffing Anesthesiologist: Bethena Midget, MD  Preanesthetic Checklist Completed: patient identified, IV checked, site marked, risks and benefits discussed, surgical consent, monitors and equipment checked, pre-op evaluation and timeout performed  Epidural Patient position: sitting Prep: DuraPrep and site prepped and draped Patient monitoring: continuous pulse ox and blood pressure Approach: midline Location: L3-L4 Injection technique: LOR air  Needle:  Needle type: Tuohy  Needle gauge: 17 G Needle length: 9 cm and 9 Needle insertion depth: 7 cm Catheter type: closed end flexible Catheter size: 19 Gauge Catheter at skin depth: 12 cm Test dose: negative  Assessment Events: blood not aspirated, no cerebrospinal fluid, injection not painful, no injection resistance, no paresthesia and negative IV test

## 2022-12-04 NOTE — Anesthesia Preprocedure Evaluation (Signed)
Anesthesia Evaluation  Patient identified by MRN, date of birth, ID band Patient awake    Reviewed: Allergy & Precautions, H&P , NPO status , Patient's Chart, lab work & pertinent test results, reviewed documented beta blocker date and time   Airway Mallampati: II  TM Distance: >3 FB Neck ROM: full    Dental no notable dental hx. (+) Teeth Intact, Dental Advisory Given   Pulmonary neg pulmonary ROS   Pulmonary exam normal breath sounds clear to auscultation       Cardiovascular negative cardio ROS Normal cardiovascular exam Rhythm:regular Rate:Normal     Neuro/Psych negative neurological ROS  negative psych ROS   GI/Hepatic negative GI ROS, Neg liver ROS,,,  Endo/Other  negative endocrine ROS    Renal/GU negative Renal ROS  negative genitourinary   Musculoskeletal   Abdominal   Peds  Hematology negative hematology ROS (+) Blood dyscrasia, anemia   Anesthesia Other Findings   Reproductive/Obstetrics (+) Pregnancy                             Anesthesia Physical Anesthesia Plan  ASA: 2  Anesthesia Plan: Epidural   Post-op Pain Management:    Induction:   PONV Risk Score and Plan: 2 and Treatment may vary due to age or medical condition  Airway Management Planned: Natural Airway  Additional Equipment: None  Intra-op Plan:   Post-operative Plan:   Informed Consent: I have reviewed the patients History and Physical, chart, labs and discussed the procedure including the risks, benefits and alternatives for the proposed anesthesia with the patient or authorized representative who has indicated his/her understanding and acceptance.       Plan Discussed with: Anesthesiologist and CRNA  Anesthesia Plan Comments:        Anesthesia Quick Evaluation

## 2022-12-04 NOTE — Progress Notes (Signed)
Labor Progress Note Suzanne Velez is a 27 y.o. [name] at [redacted]w[redacted]d presented for IOL for LGA fetus with cerbral ventriculomegaly.   S: Coping well, desires epidural placement.   O:  BP (!) 108/57 (BP Location: Right Arm)   Pulse 91   Temp 98 F (36.7 C) (Oral)   Resp 18   Ht 5\' 8"  (1.727 m)   Wt 77.5 kg   LMP 03/04/2022   BMI 25.99 kg/m  EFM: baseline 120 bpm/ mod variability/ 15 x 15 accels/ absent decels  Toco/IUPC: 3-5  SVE: Dilation: 3.5 Effacement (%): 60, 70 Station: -1 Presentation: Vertex Exam by:: Lamont Snowball, CNM Pitocin: 0 mu/min  A/P: 27 y.o. [name] [redacted]w[redacted]d  1. Labor: IOL in latent phase of labor, s/p dual route cytotec 50/15mcg. Counseled on RBA of membrane sweep AROM, patient consents to membrane sweep and AROM. Performed with bloody show noted and small amount of clear fluid. Patient tolerated procedures well. Additionally suggested another dose of oral cytotec , patient consents.  2. FWB: Cat 1 3. Pain: Coping well, desires epidural. Available on request.  4. Plan NICU attendance at birth due to fetal cerebral ventriculomegaly.   Anticipate NVSB.  Richardson Landry, CNM 5:42 PM

## 2022-12-04 NOTE — H&P (Signed)
OBSTETRIC ADMISSION HISTORY AND PHYSICAL  Suzanne Velez is a 27 y.o. female [name] with IUP at [redacted]w[redacted]d by LMP presenting for IOL for ventriculmegaly of fetus and LGA. She reports +FMs, No LOF, no VB, no blurry vision, headaches or peripheral edema, and RUQ pain.  She plans on breast and formula feeding. She request Paragard for birth control at 6 weeks postpartum visit. She received her prenatal care at  [facility]    Dating: By early LMP --->  Estimated Date of Delivery: 12/09/22  Sono:    @38  w2 d, CWD, normal anatomy, vertex presentation,  3935g, 94% EFW, anterior placenta   Prenatal History/Complications:  - Fetal mild/borderline cerebral ventriculomegaly  - Erythema nodosum  Past Medical History: Past Medical History:  Diagnosis Date   Allergies    Anemia     Past Surgical History: Past Surgical History:  Procedure Laterality Date   NO PAST SURGERIES      Obstetrical History: OB History     Gravida  3   Para  2   Term  2   Preterm      AB      Living  2      SAB      IAB      Ectopic      Multiple  0   Live Births  2           Social History Social History   Socioeconomic History   Marital status: Married    Spouse name: Not on file   Number of children: Not on file   Years of education: Not on file   Highest education level: Not on file  Occupational History   Not on file  Tobacco Use   Smoking status: Never   Smokeless tobacco: Never  Vaping Use   Vaping status: Never Used  Substance and Sexual Activity   Alcohol use: Never   Drug use: Never   Sexual activity: Yes    Birth control/protection: None  Other Topics Concern   Not on file  Social History Narrative   Not on file   Social Determinants of Health   Financial Resource Strain: Low Risk  (05/13/2022)   Overall Financial Resource Strain (CARDIA)    Difficulty of Paying Living Expenses: Not very hard  Food Insecurity: No Food Insecurity (12/04/2022)   Hunger Vital  Sign    Worried About Running Out of Food in the Last Year: Never true    Ran Out of Food in the Last Year: Never true  Transportation Needs: No Transportation Needs (12/04/2022)   PRAPARE - Administrator, Civil Service (Medical): No    Lack of Transportation (Non-Medical): No  Physical Activity: Insufficiently Active (05/13/2022)   Exercise Vital Sign    Days of Exercise per Week: 1 day    Minutes of Exercise per Session: 20 min  Stress: No Stress Concern Present (05/13/2022)   [employer] of Occupational Health - Occupational Stress Questionnaire    Feeling of Stress : Not at all  Social Connections: Moderately Integrated (05/13/2022)   Social Connection and Isolation Panel [NHANES]    Frequency of Communication with Friends and Family: More than three times a week    Frequency of Social Gatherings with Friends and Family: Three times a week    Attends Religious Services: 1 to 4 times per year    Active Member of Clubs or Organizations: No    Attends Banker Meetings: Never  Marital Status: Married    Family History: Family History  Problem Relation Age of Onset   Cancer Maternal Grandfather        liver   Asthma Paternal Grandmother     Allergies: Allergies  Allergen Reactions   Pork-Derived Products Other (See Comments)    Pt is muslim.      Medications Prior to Admission  Medication Sig Dispense Refill Last Dose   esomeprazole (NEXIUM) 10 MG packet Take 10 mg by mouth daily before breakfast.   12/03/2022 at 1100   ferrous sulfate 325 (65 FE) MG tablet Take 1 tablet (325 mg total) by mouth daily with breakfast. 30 tablet 5 Past Week   prenatal vitamin w/FE, FA (NATACHEW) 29-1 MG CHEW chewable tablet Chew 1 tablet by mouth daily at 12 noon. 30 tablet 12 12/03/2022 at 1100   acetaminophen (TYLENOL) 325 MG tablet Take 2 tablets (650 mg total) by mouth every 4 (four) hours as needed (for pain scale < 4).   More than a month     Review of  Systems   All systems reviewed and negative except as stated in HPI  Blood pressure 109/64, pulse 95, temperature 98.2 F (36.8 C), temperature source Oral, resp. rate 18, height 5\' 8"  (1.727 m), weight 77.5 kg, last menstrual period 03/04/2022. General appearance: alert, cooperative, and appears stated age Lungs: clear to auscultation bilaterally Heart: regular rate and rhythm Abdomen: soft, non-tender; bowel sounds normal Pelvic: adequate Presentation: cephalic Fetal monitoringBaseline: 120 bpm, Variability: Good {> 6 bpm), Accelerations: Reactive, and Decelerations: Absent Uterine activity Frequency: Every 3 minutes, Duration: 60 seconds, and Intensity: mild Dilation: 3 Effacement (%): 50 Station: -3 Exam by:: Dyke Brackett, RN   Prenatal labs: ABO, Rh: --/--/O POS (11/08 1201) Antibody: NEG (11/08 1201) Rubella: 1.52 (04/17 1551) RPR: NON REACTIVE (11/08 1201)  HBsAg: Negative (04/17 1551)  HIV: Non Reactive (08/21 0906)  GBS: Negative/-- (10/15 0309)  2 hr Glucola WNL Genetic screening  Low risk  Anatomy US Cerebral ventriculomegaly, followed by MFM  Prenatal Transfer Tool  Maternal Diabetes: No Genetic Screening: Normal Maternal Ultrasounds/Referrals: Other: cerebral ventriculomegaly Fetal Ultrasounds or other Referrals:  Referred to Materal Fetal Medicine  Maternal Substance Abuse:  No Significant Maternal Medications:  None Significant Maternal Lab Results:  None Number of Prenatal Visits:greater than 3 verified prenatal visits Other Comments:  None  Results for orders placed or performed during the hospital encounter of 12/04/22 (from the past 24 hour(s))  CBC   Collection Time: 12/04/22 12:01 PM  Result Value Ref Range   WBC 10.5 4.0 - 10.5 K/uL   RBC 3.70 (L) 3.87 - 5.11 MIL/uL   Hemoglobin 9.3 (L) 12.0 - 15.0 g/dL   HCT 40.9 (L) 81.1 - 91.4 %   MCV 78.9 (L) 80.0 - 100.0 fL   MCH 25.1 (L) 26.0 - 34.0 pg   MCHC 31.8 30.0 - 36.0 g/dL   RDW 78.2 95.6 -  21.3 %   Platelets 185 150 - 400 K/uL   nRBC 0.0 0.0 - 0.2 %  RPR   Collection Time: 12/04/22 12:01 PM  Result Value Ref Range   RPR Ser Ql NON REACTIVE NON REACTIVE  Type and screen   Collection Time: 12/04/22 12:01 PM  Result Value Ref Range   ABO/RH(D) O POS    Antibody Screen NEG    Sample Expiration      12/07/2022,2359 Performed at [facility], [address], [place], [place] [address]  Patient Active Problem List   Diagnosis Date Noted   LGA (large for gestational age) fetus affecting management of mother, third trimester, fetus 1 12/04/2022   Cerebral ventriculomegaly of fetus affecting care of mother 11/13/2022   Anemia complicating pregnancy, third trimester 09/24/2022   Erythema nodosum 07/24/2022   Non-English speaking patient 07/01/2022   Fibroadenoma of left breast 07/01/2022   Fibroadenoma of right breast 07/01/2022   Supervision of other normal pregnancy, antepartum 05/14/2022    Assessment/Plan:  Chrisandra Netters Naser Arrietty Gramley is a 27 y.o. [name] at [redacted]w[redacted]d here for IOL for fetal ventriculomegaly, LGA  #Labor: IOL in latent phase. Dual route cytotec 50/25 mcg administered. Will check in approximately 4 hours, consider AROM as appropriate.  #Pain: Coping well, epidural on request #FWB: Cat 1 #ID:  NA #MOF: Breast/ formula #MOC: Paragard at 6 weeks #Circ:  NA, female fetus #  Fetal Cerebral Ventriculomegaly of fetus: NICU at delivery   Richardson Landry, CNM  12/04/2022, 3:18 PM

## 2022-12-05 ENCOUNTER — Encounter (HOSPITAL_COMMUNITY): Payer: Self-pay | Admitting: Obstetrics and Gynecology

## 2022-12-05 DIAGNOSIS — O3663X0 Maternal care for excessive fetal growth, third trimester, not applicable or unspecified: Secondary | ICD-10-CM

## 2022-12-05 DIAGNOSIS — Z3A39 39 weeks gestation of pregnancy: Secondary | ICD-10-CM

## 2022-12-05 LAB — CBC
HCT: 32.2 % — ABNORMAL LOW (ref 36.0–46.0)
Hemoglobin: 10.3 g/dL — ABNORMAL LOW (ref 12.0–15.0)
MCH: 25.3 pg — ABNORMAL LOW (ref 26.0–34.0)
MCHC: 32 g/dL (ref 30.0–36.0)
MCV: 79.1 fL — ABNORMAL LOW (ref 80.0–100.0)
Platelets: 205 10*3/uL (ref 150–400)
RBC: 4.07 MIL/uL (ref 3.87–5.11)
RDW: 13.7 % (ref 11.5–15.5)
WBC: 19.2 10*3/uL — ABNORMAL HIGH (ref 4.0–10.5)
nRBC: 0 % (ref 0.0–0.2)

## 2022-12-05 MED ORDER — SODIUM CHLORIDE 0.9% FLUSH: 10.0000 mL | Freq: Two times a day (BID) | INTRAVENOUS | Status: DC

## 2022-12-05 MED ORDER — SIMETHICONE 80 MG PO CHEW: 80.0000 mg | CHEWABLE_TABLET | ORAL | Status: DC | PRN

## 2022-12-05 MED ORDER — SENNOSIDES-DOCUSATE SODIUM 8.6-50 MG PO TABS
2.0000 | ORAL_TABLET | ORAL | Status: DC
  Administered 2022-12-05 – 2022-12-06 (×2): 2 via ORAL
  Filled 2022-12-05 (×3): qty 2

## 2022-12-05 MED ORDER — PRENATAL MULTIVITAMIN CH
1.0000 | ORAL_TABLET | Freq: Every day | ORAL | Status: DC
  Administered 2022-12-05 – 2022-12-06 (×2): 1 via ORAL
  Filled 2022-12-05 (×2): qty 1

## 2022-12-05 MED ORDER — IBUPROFEN 600 MG PO TABS
600.0000 mg | ORAL_TABLET | Freq: Four times a day (QID) | ORAL | Status: DC
  Administered 2022-12-05 – 2022-12-06 (×6): 600 mg via ORAL
  Filled 2022-12-05 (×5): qty 1

## 2022-12-05 MED ORDER — WITCH HAZEL-GLYCERIN EX PADS: 1.0000 | MEDICATED_PAD | CUTANEOUS | Status: DC | PRN

## 2022-12-05 MED ORDER — COCONUT OIL OIL: 1.0000 | TOPICAL_OIL | Status: DC | PRN

## 2022-12-05 MED ORDER — METHYLERGONOVINE MALEATE 0.2 MG/ML IJ SOLN: 0.2000 mg | INTRAMUSCULAR | Status: DC | PRN

## 2022-12-05 MED ORDER — IBUPROFEN 600 MG PO TABS
ORAL_TABLET | ORAL | Status: AC
  Filled 2022-12-05: qty 1

## 2022-12-05 MED ORDER — DIPHENHYDRAMINE HCL 25 MG PO CAPS: 25.0000 mg | ORAL_CAPSULE | Freq: Four times a day (QID) | ORAL | Status: DC | PRN

## 2022-12-05 MED ORDER — ZOLPIDEM TARTRATE 5 MG PO TABS: 5.0000 mg | ORAL_TABLET | Freq: Every evening | ORAL | Status: DC | PRN

## 2022-12-05 MED ORDER — TETANUS-DIPHTH-ACELL PERTUSSIS 5-2.5-18.5 LF-MCG/0.5 IM SUSY
0.5000 mL | PREFILLED_SYRINGE | Freq: Once | INTRAMUSCULAR | Status: DC
Start: 2022-12-06 — End: 2022-12-06

## 2022-12-05 MED ORDER — METHYLERGONOVINE MALEATE 0.2 MG PO TABS: 0.2000 mg | ORAL_TABLET | ORAL | Status: DC | PRN

## 2022-12-05 MED ORDER — ACETAMINOPHEN 325 MG PO TABS: 650.0000 mg | ORAL_TABLET | ORAL | Status: DC | PRN

## 2022-12-05 MED ORDER — ONDANSETRON HCL 4 MG/2ML IJ SOLN: 4.0000 mg | INTRAMUSCULAR | Status: DC | PRN

## 2022-12-05 MED ORDER — SODIUM CHLORIDE 0.9% FLUSH
3.0000 mL | INTRAVENOUS | Status: DC | PRN
Start: 2022-12-05 — End: 2022-12-06

## 2022-12-05 MED ORDER — ONDANSETRON HCL 4 MG PO TABS: 4.0000 mg | ORAL_TABLET | ORAL | Status: DC | PRN

## 2022-12-05 MED ORDER — METHYLERGONOVINE MALEATE 0.2 MG/ML IJ SOLN
INTRAMUSCULAR | Status: AC
  Administered 2022-12-05: 0.2 mg via INTRAMUSCULAR
  Filled 2022-12-05: qty 1

## 2022-12-05 MED ORDER — SODIUM CHLORIDE 0.9% FLUSH
3.0000 mL | Freq: Two times a day (BID) | INTRAVENOUS | Status: DC
  Administered 2022-12-05: 3 mL via INTRAVENOUS

## 2022-12-05 MED ORDER — DIBUCAINE (PERIANAL) 1 % EX OINT: 1.0000 | TOPICAL_OINTMENT | CUTANEOUS | Status: DC | PRN

## 2022-12-05 MED ORDER — BENZOCAINE-MENTHOL 20-0.5 % EX AERO
1.0000 | INHALATION_SPRAY | CUTANEOUS | Status: DC | PRN
  Administered 2022-12-05: 1 via TOPICAL
  Filled 2022-12-05: qty 56

## 2022-12-05 NOTE — Anesthesia Postprocedure Evaluation (Signed)
Anesthesia Post Note  Patient: Averi Naser Abdelj Kight  Procedure(s) Performed: AN AD HOC LABOR EPIDURAL     Patient location during evaluation: Mother Baby Anesthesia Type: Epidural Level of consciousness: awake and alert and oriented Pain management: satisfactory to patient Vital Signs Assessment: post-procedure vital signs reviewed and stable Respiratory status: respiratory function stable Cardiovascular status: stable Postop Assessment: no headache, no backache, epidural receding, patient able to bend at knees, no signs of nausea or vomiting, adequate PO intake and able to ambulate Anesthetic complications: no   No notable events documented.  Last Vitals:  Vitals:   12/05/22 0458 12/05/22 0906  BP: 107/64 104/61  Pulse: 76 63  Resp: 18 16  Temp: 36.7 C 36.6 C  SpO2:  99%    Last Pain:  Vitals:   12/05/22 0906  TempSrc: Oral  PainSc:    Pain Goal:                   Kylii Ennis

## 2022-12-05 NOTE — Discharge Summary (Signed)
Postpartum Discharge Summary  Date of Service updated***     Patient Name: Suzanne Velez DOB: 1995-02-18 MRN: [id number]  Date of admission: 12/04/2022 Delivery date:12/05/2022 Delivering provider:   Date of discharge: 12/05/2022  Admitting diagnosis: LGA (large for gestational age) fetus affecting management of mother, third trimester, fetus 1 [O36.63X1] Intrauterine pregnancy: [redacted]w[redacted]d     Secondary diagnosis:  Principal Problem:   LGA (large for gestational age) fetus affecting management of mother, third trimester, fetus 1 Active Problems:   NSVD (normal spontaneous vaginal delivery)  Additional problems: ***    Discharge diagnosis: Term Pregnancy Delivered                                              Post partum procedures:{Postpartum procedures:23558} Augmentation: Cytotec, AROM Complications: None  Hospital course: Onset of Labor With Vaginal Delivery      27 y.o. yo [id number] at [redacted]w[redacted]d was admitted in Active Labor on 12/04/2022. Labor course was complicated by Cerebral ventriculomegaly of fetus. Membrane Rupture Time/Date: 5:31 PM,12/04/2022  Delivery Method:Vaginal, Spontaneous Operative Delivery:N/A Episiotomy: None Lacerations:  1st degree;Perineal Patient had a postpartum course complicated by ***.  She is ambulating, tolerating a regular diet, passing flatus, and urinating well. Patient is discharged home in stable condition on 12/05/22.  Newborn Data: Birth date:12/05/2022 Birth time:1:40 AM Gender:Female Living status:Living Apgars:8 ,9  Weight:3685 g  Magnesium Sulfate received: {Mag received:30440022} BMZ received: No Rhophylac:N/A MMR:Yes T-DaP:Given prenatally Flu: Declined RSV Vaccine received: {RSV:31013} Transfusion:{Transfusion received:30440034}  Immunizations received: Immunization History  Administered Date(s) Administered   Tdap 09/30/2020, 09/16/2022    Physical exam  Vitals:   12/04/22 2330 12/05/22 0001 12/05/22 0031  12/05/22 0101  BP: (!) 97/41 (!) 92/55 (!) 104/59 (!) 101/59  Pulse: 76 79 80 80  Resp:      Temp:      TempSrc:      SpO2:      Weight:      Height:       General: {Exam; general:21111117} Lochia: {Desc; appropriate/inappropriate:30686::"appropriate"} Uterine Fundus: {Desc; firm/soft:30687} Incision: {Exam; incision:21111123} DVT Evaluation: {Exam; dvt:2111122} Labs: Lab Results  Component Value Date   WBC 10.5 12/04/2022   HGB 9.3 (L) 12/04/2022   HCT 29.2 (L) 12/04/2022   MCV 78.9 (L) 12/04/2022   PLT 185 12/04/2022      Latest Ref Rng & Units 08/21/2020   12:11 AM  CMP  Glucose 70 - 99 mg/dL 94   BUN 6 - 20 mg/dL 10   Creatinine 7.61 - 1.00 mg/dL 6.07   Sodium 371 - 062 mmol/L 139   Potassium 3.5 - 5.1 mmol/L 3.5   Chloride 98 - 111 mmol/L 105   CO2 22 - 32 mmol/L 27   Calcium 8.9 - 10.3 mg/dL 8.9   Total Protein 6.5 - 8.1 g/dL 7.2   Total Bilirubin 0.3 - 1.2 mg/dL 0.3   Alkaline Phos 38 - 126 U/L 58   AST 15 - 41 U/L 17   ALT 0 - 44 U/L 22    Edinburgh Score:    06/26/2020    9:00 PM  Edinburgh Postnatal Depression Scale Screening Tool  I have been able to laugh and see the funny side of things. 0  I have looked forward with enjoyment to things. 0  I have blamed myself unnecessarily when things went wrong. 0  I have been anxious or worried for no good reason. 0  I have felt scared or panicky for no good reason. 0  Things have been getting on top of me. 0  I have been so unhappy that I have had difficulty sleeping. 0  I have felt sad or miserable. 0  I have been so unhappy that I have been crying. 0  The thought of harming myself has occurred to me. 0  Edinburgh Postnatal Depression Scale Total 0   No data recorded  After visit meds:  Allergies as of 12/05/2022       Reactions   Pork-derived Products Other (See Comments)   Pt is muslim.       Med Rec must be completed prior to using this [facility]***        Discharge home in stable  condition Infant Feeding: Breast Infant Disposition:{CHL IP OB HOME WITH WJXBJY:78295} Discharge instruction: per After Visit Summary and Postpartum booklet. Activity: Advance as tolerated. Pelvic rest for 6 weeks.  Diet: routine diet Future Appointments: Future Appointments  Date Time Provider Department Center  01/14/2023 11:40 AM Tobb, Lavona [place]ound, DO [facility] None  01/18/2023  1:35 PM Lo, Toma Aran, CNM [facility]   Follow up Visit:  Follow-up Information     [facility] Follow up in 6 week(s).   Contact information: [address] [place] [address]               Message sent to [facility] 11/9  Please schedule this patient for a In person postpartum visit in 6 weeks with the following provider: Any provider. Additional Postpartum F/U:Postpartum Depression checkup  Low risk pregnancy complicated by:  Cerebral ventriculomegaly of fetus Delivery mode:  Vaginal, Spontaneous Anticipated Birth Control:  IUD ParaGard out patient   12/05/2022 Wyn Forster, MD

## 2022-12-05 NOTE — Plan of Care (Signed)
  Problem: Education: Goal: Knowledge of General Education information will improve Description: Including pain rating scale, medication(s)/side effects and non-pharmacologic comfort measures Outcome: Completed/Met

## 2022-12-05 NOTE — Lactation Note (Signed)
This note was copied from a baby's chart. Lactation Consultation Note  Patient Name: Suzanne Velez NUUVO'Z Date: 12/05/2022 Age:25 hours Reason for consult: Initial assessment;Term.  MOB decline interpreter , she is fluent  in [place]. P3, term female infant,Per MOB infant recently BF 25 minutes, LC did not observe latch. Per MOB, infant is latch well no concerns for LC at this time. MOB is experienced with breastfeeding see maternal data below. LC discussed infant's input and output. LC gave MOB  a hand pump with 24 mm at [facility] request for PRN home use. MOB, was made aware of O/P services, breastfeeding support groups, community resources, and our phone # for post-discharge questions.    Current feeding plan: 1- MOB will continue to BF infant by cues, on demand, every 2-3 hours, skin to skin. 2- LC discussed maternal diet, rest and hydration.   Maternal Data Has patient been taught Hand Expression?: Yes Does the patient have breastfeeding experience prior to this delivery?: Yes How long did the patient breastfeed?: Per MOB, BF 1st child for 6 months, 2nd child only 1 month, her goal is to breastfeed baby #3 for 6 months.  Feeding Mother's Current Feeding Choice: Breast Milk  LATCH Score                    Lactation Tools Discussed/Used    Interventions Interventions: Breast feeding basics reviewed;Assisted with latch;Skin to skin;Breast compression;Adjust position;Support pillows;Position options;Expressed milk;Education;LC Services brochure;CDC Guidelines for Breast Pump Cleaning  Discharge    Consult Status Consult Status: Follow-up Date: 12/06/22 Follow-up type: In-patient    Suzanne Velez 12/05/2022, 7:27 PM

## 2022-12-06 MED ORDER — IBUPROFEN 600 MG PO TABS: 600.0000 mg | ORAL_TABLET | Freq: Four times a day (QID) | ORAL | 0 refills | Status: DC

## 2022-12-06 NOTE — Progress Notes (Signed)
POSTPARTUM PROGRESS NOTE  Post Partum Day 1  Subjective:  Suzanne Velez is a 27 y.o. [id number] s/p SVD at [redacted]w[redacted]d.  She reports she is doing well. No acute events overnight. She denies any problems with ambulating, voiding or po intake. Denies nausea or vomiting.  Pain is well controlled.  Lochia is appropriate.  Objective: Blood pressure 99/60, pulse 61, temperature 97.7 F (36.5 C), temperature source Oral, resp. rate 18, height 5\' 8"  (1.727 m), weight 77.5 kg, last menstrual period 03/04/2022, SpO2 100%, unknown if currently breastfeeding.  Physical Exam:  General: alert, cooperative and no distress Chest: no respiratory distress Heart:regular rate, distal pulses intact Uterine Fundus: firm, appropriately tender DVT Evaluation: No calf swelling or tenderness Extremities: trace edema Skin: warm, dry  Recent Labs    12/04/22 1201 12/05/22 0436  HGB 9.3* 10.3*  HCT 29.2* 32.2*    Assessment/Plan: Suzanne Velez is a 27 y.o. [id number] s/p SVD at [redacted]w[redacted]d   PPD#1 - Doing well  Routine postpartum care  Contraception: Paragard for birth control at 6 weeks postpartum visit Feeding: breast and formula  Dispo: Plan for discharge 11/11.   LOS: 2 days   Wyn Forster, MD OB Fellow  12/06/2022, 6:29 AM

## 2022-12-06 NOTE — Progress Notes (Signed)
MOB and FOB speak Arabic and [place]. But the FOB chose to be the patient's interpreter. The Rn gave the patient a Mother and baby booklet .

## 2022-12-06 NOTE — Plan of Care (Signed)
  Problem: Clinical Measurements: Goal: Ability to maintain clinical measurements within normal limits will improve Outcome: Completed/Met Goal: Will remain free from infection Outcome: Completed/Met Goal: Diagnostic test results will improve Outcome: Completed/Met Goal: Respiratory complications will improve Outcome: Completed/Met   Problem: Activity: Goal: Risk for activity intolerance will decrease Outcome: Completed/Met   Problem: Nutrition: Goal: Adequate nutrition will be maintained Outcome: Completed/Met

## 2022-12-06 NOTE — Lactation Note (Signed)
This note was copied from a baby's chart. Lactation Consultation Note  Patient Name: Suzanne Velez [id number] Date: 12/06/2022 Age:25 hours Reason for consult: Follow-up assessment;Term;Infant weight loss (4 % weight loss LC reviewed BF D/C tx and stressing engorgement prevention and tx. Per mom per dad for interpreting Arabic. BF is going well)   Maternal Data Has patient been taught Hand Expression?: Yes  Feeding Mother's Current Feeding Choice: Breast Milk  LATCH Score Latch:  (latched with depth)  Audible Swallowing:  (swallows noted)     Comfort (Breast/Nipple):  (per mom cramping)  Hold (Positioning):  (mom had latched the baby)      Lactation Tools Discussed/Used Tools: Pump Breast pump type: Manual Pump Education: Milk Storage  Interventions Interventions: Breast feeding basics reviewed;Hand pump;Education;LC Services brochure  Discharge Discharge Education: Engorgement and breast care;Warning signs for feeding baby Pump: Manual  Consult Status Consult Status: Complete Date: 12/06/22    Kathrin Greathouse 12/06/2022, 3:25 PM

## 2022-12-08 LAB — SURGICAL PATHOLOGY

## 2022-12-10 ENCOUNTER — Encounter (HOSPITAL_BASED_OUTPATIENT_CLINIC_OR_DEPARTMENT_OTHER): Payer: Medicaid Other | Admitting: Obstetrics & Gynecology

## 2022-12-12 ENCOUNTER — Encounter (HOSPITAL_BASED_OUTPATIENT_CLINIC_OR_DEPARTMENT_OTHER): Payer: Self-pay | Admitting: Obstetrics & Gynecology

## 2022-12-14 ENCOUNTER — Other Ambulatory Visit (HOSPITAL_BASED_OUTPATIENT_CLINIC_OR_DEPARTMENT_OTHER): Payer: Medicaid Other

## 2022-12-14 DIAGNOSIS — R17 Unspecified jaundice: Secondary | ICD-10-CM

## 2022-12-15 LAB — HEPATIC FUNCTION PANEL
ALT: 17 [IU]/L (ref 0–32)
AST: 17 [IU]/L (ref 0–40)
Albumin: 4.2 g/dL (ref 4.0–5.0)
Alkaline Phosphatase: 128 [IU]/L — ABNORMAL HIGH (ref 44–121)
Bilirubin Total: 0.2 mg/dL (ref 0.0–1.2)
Bilirubin, Direct: 0.1 mg/dL (ref 0.00–0.40)
Total Protein: 6.5 g/dL (ref 6.0–8.5)

## 2022-12-19 ENCOUNTER — Telehealth (HOSPITAL_COMMUNITY): Payer: Self-pay

## 2022-12-19 NOTE — Telephone Encounter (Signed)
12/19/2022 [id number]  Name: Suzanne Velez MRN: [id number] DOB: February 14, 1995  Reason for Call:  Transition of Care Hospital Discharge Call  Contact Status: Patient Contact Status: Complete  Language assistant needed: Interpreter Mode: Telephonic Interpreter Interpreter Name: [phone] Interpreter Phone Number - If applicable: [phone]        Follow-Up Questions: Do You Have Any Concerns About Your Health As You Heal From Delivery?: Yes What Concerns Do You Have About Your Health?: Patient reports little/no bleeding after vaginal delivery. RN reviewed normal lochia amounts, color, and duration of bleeding. RN also reviewed what to much bleeding looks like and when to call her OB-GYN. Patient declines any other questions or concerns at this time. Do You Have Any Concerns About Your Infants Health?: No  Edinburgh Postnatal Depression Scale:  In the Past 7 Days:    PHQ2-9 Depression Scale:     Discharge Follow-up: Edinburgh score requires follow up?:  (RN explained EPDS and patient declines wanting to do EPDS. She states that she has good family support and is doing well emotionally. RN told patient to reach out to her OB if she ever has increased feelings of worry, anxiety, sadness, depression.)  Post-discharge interventions: Reviewed Newborn Safe Sleep Practices  Signature  Signe Colt

## 2023-01-04 ENCOUNTER — Encounter (HOSPITAL_BASED_OUTPATIENT_CLINIC_OR_DEPARTMENT_OTHER): Payer: Self-pay | Admitting: Certified Nurse Midwife

## 2023-01-04 ENCOUNTER — Ambulatory Visit (HOSPITAL_BASED_OUTPATIENT_CLINIC_OR_DEPARTMENT_OTHER): Payer: Medicaid Other | Admitting: Certified Nurse Midwife

## 2023-01-04 VITALS — BP 110/71 | HR 70 | Ht 68.0 in | Wt 153.4 lb

## 2023-01-04 DIAGNOSIS — Z30430 Encounter for insertion of intrauterine contraceptive device: Secondary | ICD-10-CM | POA: Diagnosis not present

## 2023-01-04 MED ORDER — PARAGARD INTRAUTERINE COPPER IU IUD
1.0000 | INTRAUTERINE_SYSTEM | Freq: Once | INTRAUTERINE | Status: AC
  Administered 2023-01-04: 1 via INTRAUTERINE

## 2023-01-04 NOTE — Progress Notes (Signed)
  Subjective:     Suzanne Velez is a 27 y.o. female who presents for a postpartum visit. She is 6 weeks postpartum following a spontaneous vaginal delivery. I have fully reviewed the prenatal and intrapartum course. The delivery was at 39 gestational weeks. Outcome: spontaneous vaginal delivery. Anesthesia: epidural. Postpartum course has been uneventful. Baby's course has been uneventful. Baby is feeding by breast. Bleeding no bleeding. Bowel function is normal. Bladder function is normal. Patient is sexually active. Contraception method is IUD. Postpartum depression screening: negative.  The following portions of the patient's history were reviewed and updated as appropriate: allergies, current medications, past family history, past medical history, past social history, past surgical history, and problem list.  Review of Systems Pertinent items are noted in HPI.   Objective:    BP 110/71 (BP Location: Right Arm, Patient Position: Sitting, Cuff Size: Normal)   Pulse 70   Ht 5\' 8"  (1.727 m)   Wt 153 lb 6.4 oz (69.6 kg)   LMP 03/04/2022   Breastfeeding Yes   BMI 23.32 kg/m   General:  alert, cooperative, and appears stated age   Breasts:  lactating  Lungs:   Heart:    Abdomen: soft, non-tender; bowel sounds normal; no masses,  no organomegaly   Vulva:  normal  Vagina: normal vagina, no discharge, exudate, lesion, or erythema  Cervix:  multiparous appearance  Corpus: normal size, contour, position, consistency, mobility, non-tender  Adnexa:    Rectal Exam:         Assessment:    G3P3 Term SVD of a viable 8lb 2oz female here for routine  postpartum exam. Pap smear not due  at today's visit.  Breastfeeding Postpartum Depression Screen Negative Encounter for insertion of Paragard IUD     GYNECOLOGY OFFICE PROCEDURE NOTE   IUD Insertion Procedure Note Patient identified, informed consent performed, consent signed.   Discussed risks of irregular bleeding, cramping,  infection, malpositioning or misplacement of the IUD outside the uterus which may require further procedure such as laparoscopy. Time out was performed.  Urine pregnancy test negative.  Speculum placed in the vagina.  Cervix visualized.  Cleaned with Betadine x 2.  Grasped anteriorly with a single tooth tenaculum.  Uterus sounded to 6 cm.  Paragard UD placed per manufacturer's recommendations.  Strings trimmed to 3 cm. Tenaculum was removed, good hemostasis noted.  Patient tolerated procedure well.   RTO 2 months for IUD check. RTO 1 year for annual gyn exam.  Letta Kocher, CNM 2:52 PM

## 2023-01-14 ENCOUNTER — Ambulatory Visit: Payer: Medicaid Other | Admitting: Cardiology

## 2023-01-18 ENCOUNTER — Ambulatory Visit (HOSPITAL_BASED_OUTPATIENT_CLINIC_OR_DEPARTMENT_OTHER): Payer: Medicaid Other | Admitting: Certified Nurse Midwife

## 2023-02-25 ENCOUNTER — Ambulatory Visit: Payer: Medicaid Other | Attending: Cardiology | Admitting: Cardiology

## 2023-10-04 ENCOUNTER — Ambulatory Visit (HOSPITAL_BASED_OUTPATIENT_CLINIC_OR_DEPARTMENT_OTHER): Admitting: Certified Nurse Midwife

## 2023-10-04 ENCOUNTER — Encounter (HOSPITAL_BASED_OUTPATIENT_CLINIC_OR_DEPARTMENT_OTHER): Payer: Self-pay | Admitting: Certified Nurse Midwife

## 2023-10-04 VITALS — BP 109/69 | HR 67 | Wt 142.2 lb

## 2023-10-04 DIAGNOSIS — N920 Excessive and frequent menstruation with regular cycle: Secondary | ICD-10-CM | POA: Diagnosis not present

## 2023-10-04 DIAGNOSIS — T148XXA Other injury of unspecified body region, initial encounter: Secondary | ICD-10-CM | POA: Insufficient documentation

## 2023-10-04 DIAGNOSIS — Z30431 Encounter for routine checking of intrauterine contraceptive device: Secondary | ICD-10-CM | POA: Diagnosis not present

## 2023-10-04 DIAGNOSIS — R233 Spontaneous ecchymoses: Secondary | ICD-10-CM

## 2023-10-04 LAB — CBC
Hematocrit: 36.1 % (ref 34.0–46.6)
Hemoglobin: 12.1 g/dL (ref 11.1–15.9)
MCH: 28.9 pg (ref 26.6–33.0)
MCHC: 33.5 g/dL (ref 31.5–35.7)
MCV: 86 fL (ref 79–97)
Platelets: 242 x10E3/uL (ref 150–450)
RBC: 4.19 x10E6/uL (ref 3.77–5.28)
RDW: 13 % (ref 11.7–15.4)
WBC: 7.4 x10E3/uL (ref 3.4–10.8)

## 2023-10-04 NOTE — Progress Notes (Signed)
 Subjective:     Suzanne Velez is a 28 y.o. female who presents for gyn visit. Pt has Paragard  Copper  IUD. States her periods are now heavy and crampy. She is here for IUD check. Pt also states she gets small bruises very easily and unsure why.   The following portions of the patient's history were reviewed and updated as appropriate: allergies, current medications, past family history, past medical history, past social history, past surgical history, and problem list.   Review of Systems Pertinent items are noted in HPI.    Objective:    BP 109/69   Pulse 67   Wt 142 lb 3.2 oz (64.5 kg)   LMP 10/01/2023   BMI 21.62 kg/m  General appearance: alert, cooperative, and appears stated age Abdomen: soft, non-tender; bowel sounds normal; no masses,  no organomegaly Pelvic: cervix normal in appearance, external genitalia normal, no adnexal masses or tenderness, no cervical motion tenderness, rectovaginal septum normal, uterus normal size, shape, and consistency, vagina normal without discharge, and IUD strings visible 3cm. Pt on her period (heavy).    Assessment:    Surveillance Paragard  IUD.    Plan:    IUD string check complete CBC pending US  to confirm correct location of Paragard  IUD within uterine cavity-ordered. Arland MARLA Roller

## 2023-10-20 ENCOUNTER — Ambulatory Visit (HOSPITAL_BASED_OUTPATIENT_CLINIC_OR_DEPARTMENT_OTHER): Payer: Self-pay | Admitting: Certified Nurse Midwife

## 2023-11-03 ENCOUNTER — Ambulatory Visit (INDEPENDENT_AMBULATORY_CARE_PROVIDER_SITE_OTHER): Payer: Self-pay | Admitting: Certified Nurse Midwife

## 2023-11-03 ENCOUNTER — Encounter (HOSPITAL_BASED_OUTPATIENT_CLINIC_OR_DEPARTMENT_OTHER): Payer: Self-pay | Admitting: Certified Nurse Midwife

## 2023-11-03 ENCOUNTER — Other Ambulatory Visit (HOSPITAL_BASED_OUTPATIENT_CLINIC_OR_DEPARTMENT_OTHER)

## 2023-11-03 VITALS — BP 94/70 | HR 75

## 2023-11-03 DIAGNOSIS — N946 Dysmenorrhea, unspecified: Secondary | ICD-10-CM | POA: Diagnosis not present

## 2023-11-03 DIAGNOSIS — N920 Excessive and frequent menstruation with regular cycle: Secondary | ICD-10-CM

## 2023-11-03 DIAGNOSIS — Z30431 Encounter for routine checking of intrauterine contraceptive device: Secondary | ICD-10-CM | POA: Diagnosis not present

## 2023-11-04 ENCOUNTER — Encounter (HOSPITAL_BASED_OUTPATIENT_CLINIC_OR_DEPARTMENT_OTHER): Payer: Self-pay | Admitting: Certified Nurse Midwife

## 2023-11-04 NOTE — Progress Notes (Signed)
  Pt is a G3P3003 here for follow-up (US ) from her visit on 10/04/23. At that time pt reported she has Copper  IUD but noted her periods seemed now heavier and crampier. Pt was scheduled for US  to ensure correct location of IUD. US  results reviewed w/ patient today. IUD in correct location with arms extended at the fundus.    EXAM: TRANSVAGINAL ULTRASOUND OF PELVIS   TECHNIQUE: Transvaginal ultrasound examination of the pelvis was performed for imaging of the pelvis including uterus, ovaries, adnexal regions, and pelvic cul-de-sac.     FINDINGS: Uterus: 8.4 x 4.9 x 3.9 cm.  Volume: 84.7 ml   Endometrial thickness: 5.7 mm with IUD in correct location.  Arms extended at the fundus.   Right ovary: 3.7 x 3.0 x 2.8 cm with 23 mm follicle.  Volume: 16.6 ml   Left ovary: 1.9 x 1.9 x 2.0.  Volume: 4.0 ml   Other findings:  No abnormal free fluid.  Adnexa within normal limits.  Cervix within normal limits.  Assessment: Surveillance of Paragard  IUD  Plan: Reviewed US  findings w/ patient and she verbalized understanding. She is satisfied with Paragard  and desires to continue with Paragard  IUD contraception.  Arland MARLA Roller

## 2023-11-10 DIAGNOSIS — N946 Dysmenorrhea, unspecified: Secondary | ICD-10-CM | POA: Insufficient documentation

## 2024-02-10 ENCOUNTER — Encounter (HOSPITAL_BASED_OUTPATIENT_CLINIC_OR_DEPARTMENT_OTHER): Payer: Self-pay | Admitting: Obstetrics and Gynecology

## 2024-02-10 ENCOUNTER — Ambulatory Visit (INDEPENDENT_AMBULATORY_CARE_PROVIDER_SITE_OTHER): Admitting: Obstetrics and Gynecology

## 2024-02-10 VITALS — BP 108/69 | HR 83 | Wt 137.6 lb

## 2024-02-10 DIAGNOSIS — N6325 Unspecified lump in the left breast, overlapping quadrants: Secondary | ICD-10-CM | POA: Diagnosis not present

## 2024-02-10 DIAGNOSIS — Z603 Acculturation difficulty: Secondary | ICD-10-CM | POA: Diagnosis not present

## 2024-02-10 DIAGNOSIS — N6310 Unspecified lump in the right breast, unspecified quadrant: Secondary | ICD-10-CM | POA: Diagnosis not present

## 2024-02-10 DIAGNOSIS — Z758 Other problems related to medical facilities and other health care: Secondary | ICD-10-CM

## 2024-02-10 DIAGNOSIS — N63 Unspecified lump in unspecified breast: Secondary | ICD-10-CM

## 2024-02-10 NOTE — Progress Notes (Signed)
" ° °  GYNECOLOGY PROGRESS NOTE  History:  29 y.o. G3P3003 presents to [facility] for bilateral breast mass.  Since 2020 had breast ultrasound and was told she needed them every 6 months.  Has Paragard  IUD  01/04/23, going well  No other complaints   The following portions of the patient's history were reviewed and updated as appropriate: allergies, current medications, past family history, past medical history, past social history, past surgical history and problem list. Last pap smear on 06/09/22 was norma  Health Maintenance Due  Topic Date Due   COVID-19 Vaccine (1) Never done   HPV VACCINES (1 - Risk 3-dose series) Never done   Hepatitis B Vaccines 19-59 Average Risk (1 of 3 - 19+ 3-dose series) Never done   Influenza Vaccine  Never done     Review of Systems:  Pertinent items are noted in HPI.   Objective:  Physical Exam Blood pressure 108/69, pulse 83, weight 137 lb 9.6 oz (62.4 kg), last menstrual period 02/01/2024, SpO2 99%, not currently breastfeeding. VS reviewed, nursing note reviewed,  Constitutional: well developed, well nourished, no distress HEENT: normocephalic Pulm/chest wall: normal effort Breast Exam: normal appearance, palpable 1cm lesion 12 oclock left breast, no differentiated palpable right breast mass.  Neuro: alert and oriented  Skin: warm, dry Psych: affect normal Pelvic exam: deferred  Assessment & Plan:  1. Bilateral breast mass in female (Primary) Ultrasound ordered to reassess bilateral breast, follow up pending images  - US  LIMITED ULTRASOUND INCLUDING AXILLA LEFT BREAST ; Future - US  LIMITED ULTRASOUND INCLUDING AXILLA RIGHT BREAST; Future  2. Language barrier   Nidia Daring, FNP  "

## 2024-02-22 ENCOUNTER — Other Ambulatory Visit

## 2024-02-29 ENCOUNTER — Ambulatory Visit: Payer: Self-pay | Admitting: Obstetrics and Gynecology

## 2024-02-29 ENCOUNTER — Ambulatory Visit
Admission: RE | Admit: 2024-02-29 | Discharge: 2024-02-29 | Disposition: A | Source: Ambulatory Visit | Attending: Obstetrics and Gynecology

## 2024-02-29 DIAGNOSIS — N63 Unspecified lump in unspecified breast: Secondary | ICD-10-CM

## 2024-03-06 ENCOUNTER — Ambulatory Visit (HOSPITAL_BASED_OUTPATIENT_CLINIC_OR_DEPARTMENT_OTHER): Payer: Medicaid Other | Admitting: Certified Nurse Midwife
